# Patient Record
Sex: Male | Born: 1950
Health system: Southern US, Community
[De-identification: ages and names within clinical notes are randomized; demographics above are authoritative.]

## PROBLEM LIST (undated history)

## (undated) DIAGNOSIS — I10 Essential (primary) hypertension: Secondary | ICD-10-CM

## (undated) DIAGNOSIS — I2510 Atherosclerotic heart disease of native coronary artery without angina pectoris: Secondary | ICD-10-CM

## (undated) HISTORY — PX: CORONARY STENT PLACEMENT: SHX1402

---

## 2009-01-29 ENCOUNTER — Ambulatory Visit: Payer: Self-pay | Admitting: Diagnostic Radiology

## 2009-01-29 ENCOUNTER — Emergency Department (HOSPITAL_BASED_OUTPATIENT_CLINIC_OR_DEPARTMENT_OTHER): Admission: EM | Admit: 2009-01-29 | Discharge: 2009-01-29 | Payer: Self-pay | Admitting: Emergency Medicine

## 2011-02-03 LAB — COMPREHENSIVE METABOLIC PANEL
AST: 43 U/L — ABNORMAL HIGH (ref 0–37)
Albumin: 3.7 g/dL (ref 3.5–5.2)
BUN: 18 mg/dL (ref 6–23)
Calcium: 8.6 mg/dL (ref 8.4–10.5)
Creatinine, Ser: 1 mg/dL (ref 0.4–1.5)
GFR calc Af Amer: >60 mL/min (ref >60)
GFR calc non Af Amer: >60 mL/min (ref >60)

## 2011-02-03 LAB — DIFFERENTIAL
Eosinophils Relative: 2 % (ref 0–5)
Lymphocytes Relative: 15 % (ref 12–46)
Lymphs Abs: 1.5 10*3/uL (ref 0.7–4.0)
Monocytes Absolute: 0.9 10*3/uL (ref 0.1–1.0)
Neutro Abs: 7.6 10*3/uL (ref 1.7–7.7)

## 2011-02-03 LAB — LIPASE, BLOOD: Lipase: 22 U/L — ABNORMAL LOW (ref 23–300)

## 2011-02-03 LAB — CBC
HCT: 40.8 % (ref 39.0–52.0)
MCHC: 34.8 g/dL (ref 30.0–36.0)
MCV: 95.4 fL (ref 78.0–100.0)
Platelets: 231 10*3/uL (ref 150–400)

## 2011-02-03 LAB — POCT CARDIAC MARKERS
CKMB, poc: 24.4 ng/mL (ref 1.0–8.0)
Troponin i, poc: 0.88 ng/mL (ref 0.00–0.09)

## 2016-12-23 DIAGNOSIS — I739 Peripheral vascular disease, unspecified: Secondary | ICD-10-CM | POA: Diagnosis not present

## 2016-12-23 DIAGNOSIS — I252 Old myocardial infarction: Secondary | ICD-10-CM | POA: Diagnosis not present

## 2016-12-23 DIAGNOSIS — E7800 Pure hypercholesterolemia, unspecified: Secondary | ICD-10-CM | POA: Diagnosis not present

## 2016-12-23 DIAGNOSIS — I2510 Atherosclerotic heart disease of native coronary artery without angina pectoris: Secondary | ICD-10-CM | POA: Diagnosis not present

## 2016-12-30 DIAGNOSIS — I2510 Atherosclerotic heart disease of native coronary artery without angina pectoris: Secondary | ICD-10-CM | POA: Diagnosis not present

## 2016-12-30 DIAGNOSIS — E7800 Pure hypercholesterolemia, unspecified: Secondary | ICD-10-CM | POA: Diagnosis not present

## 2017-03-25 DIAGNOSIS — Z85828 Personal history of other malignant neoplasm of skin: Secondary | ICD-10-CM | POA: Diagnosis not present

## 2017-03-25 DIAGNOSIS — L814 Other melanin hyperpigmentation: Secondary | ICD-10-CM | POA: Diagnosis not present

## 2017-03-25 DIAGNOSIS — L821 Other seborrheic keratosis: Secondary | ICD-10-CM | POA: Diagnosis not present

## 2017-03-25 DIAGNOSIS — C44311 Basal cell carcinoma of skin of nose: Secondary | ICD-10-CM | POA: Diagnosis not present

## 2017-03-25 DIAGNOSIS — Z08 Encounter for follow-up examination after completed treatment for malignant neoplasm: Secondary | ICD-10-CM | POA: Diagnosis not present

## 2017-03-25 DIAGNOSIS — D485 Neoplasm of uncertain behavior of skin: Secondary | ICD-10-CM | POA: Diagnosis not present

## 2017-03-25 DIAGNOSIS — L570 Actinic keratosis: Secondary | ICD-10-CM | POA: Diagnosis not present

## 2017-04-26 ENCOUNTER — Other Ambulatory Visit: Payer: Self-pay | Admitting: *Deleted

## 2017-04-26 NOTE — Patient Outreach (Signed)
[facility]) Care Management  04/26/2017  Calvin Moses [birth year] [id number]   Member identified as high risk according to [employer] health questionnaire.  Call placed to introduce [facility] care management services and perform telephone screening.  No answer, HIPAA compliant voice message left.  Will await call back, if no call back, will follow up within the next week.  Valente David, [place], MSN [place] [phone]

## 2017-04-29 ENCOUNTER — Other Ambulatory Visit: Payer: Self-pay | Admitting: *Deleted
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
## 2017-05-05 ENCOUNTER — Encounter: Payer: Self-pay | Admitting: *Deleted

## 2017-05-12 DIAGNOSIS — C44311 Basal cell carcinoma of skin of nose: Secondary | ICD-10-CM | POA: Diagnosis not present

## 2017-05-12 DIAGNOSIS — N529 Male erectile dysfunction, unspecified: Secondary | ICD-10-CM | POA: Diagnosis not present

## 2017-05-23 DIAGNOSIS — M109 Gout, unspecified: Secondary | ICD-10-CM | POA: Diagnosis not present

## 2017-05-23 DIAGNOSIS — S53401A Unspecified sprain of right elbow, initial encounter: Secondary | ICD-10-CM | POA: Diagnosis not present

## 2017-06-07 DIAGNOSIS — C44311 Basal cell carcinoma of skin of nose: Secondary | ICD-10-CM | POA: Diagnosis not present

## 2017-11-24 DIAGNOSIS — D123 Benign neoplasm of transverse colon: Secondary | ICD-10-CM | POA: Diagnosis not present

## 2017-11-24 DIAGNOSIS — D121 Benign neoplasm of appendix: Secondary | ICD-10-CM | POA: Diagnosis not present

## 2017-11-24 DIAGNOSIS — K635 Polyp of colon: Secondary | ICD-10-CM | POA: Diagnosis not present

## 2017-11-24 DIAGNOSIS — K648 Other hemorrhoids: Secondary | ICD-10-CM | POA: Diagnosis not present

## 2017-11-24 DIAGNOSIS — Z86010 Personal history of colonic polyps: Secondary | ICD-10-CM | POA: Diagnosis not present

## 2017-11-24 DIAGNOSIS — Z800 Family history of malignant neoplasm of digestive organs: Secondary | ICD-10-CM | POA: Diagnosis not present

## 2017-11-24 DIAGNOSIS — K5730 Diverticulosis of large intestine without perforation or abscess without bleeding: Secondary | ICD-10-CM | POA: Diagnosis not present

## 2017-11-24 DIAGNOSIS — Z1211 Encounter for screening for malignant neoplasm of colon: Secondary | ICD-10-CM | POA: Diagnosis not present

## 2017-12-02 DIAGNOSIS — J019 Acute sinusitis, unspecified: Secondary | ICD-10-CM | POA: Diagnosis not present

## 2017-12-02 DIAGNOSIS — M109 Gout, unspecified: Secondary | ICD-10-CM | POA: Diagnosis not present

## 2018-01-12 DIAGNOSIS — H524 Presbyopia: Secondary | ICD-10-CM | POA: Diagnosis not present

## 2018-06-02 DIAGNOSIS — E7849 Other hyperlipidemia: Secondary | ICD-10-CM | POA: Diagnosis not present

## 2018-06-02 DIAGNOSIS — I2510 Atherosclerotic heart disease of native coronary artery without angina pectoris: Secondary | ICD-10-CM | POA: Diagnosis not present

## 2018-06-02 DIAGNOSIS — Z8673 Personal history of transient ischemic attack (TIA), and cerebral infarction without residual deficits: Secondary | ICD-10-CM | POA: Diagnosis not present

## 2018-06-02 DIAGNOSIS — I70213 Atherosclerosis of native arteries of extremities with intermittent claudication, bilateral legs: Secondary | ICD-10-CM | POA: Diagnosis not present

## 2018-12-08 DIAGNOSIS — E7849 Other hyperlipidemia: Secondary | ICD-10-CM | POA: Diagnosis not present

## 2018-12-08 DIAGNOSIS — I739 Peripheral vascular disease, unspecified: Secondary | ICD-10-CM | POA: Diagnosis not present

## 2018-12-08 DIAGNOSIS — Z23 Encounter for immunization: Secondary | ICD-10-CM | POA: Diagnosis not present

## 2018-12-08 DIAGNOSIS — Z125 Encounter for screening for malignant neoplasm of prostate: Secondary | ICD-10-CM | POA: Diagnosis not present

## 2018-12-08 DIAGNOSIS — C44311 Basal cell carcinoma of skin of nose: Secondary | ICD-10-CM | POA: Diagnosis not present

## 2018-12-08 DIAGNOSIS — Z0000 Encounter for general adult medical examination without abnormal findings: Secondary | ICD-10-CM | POA: Diagnosis not present

## 2018-12-08 DIAGNOSIS — I10 Essential (primary) hypertension: Secondary | ICD-10-CM | POA: Diagnosis not present

## 2018-12-08 DIAGNOSIS — I2510 Atherosclerotic heart disease of native coronary artery without angina pectoris: Secondary | ICD-10-CM | POA: Diagnosis not present

## 2018-12-09 DIAGNOSIS — Z125 Encounter for screening for malignant neoplasm of prostate: Secondary | ICD-10-CM | POA: Diagnosis not present

## 2018-12-09 DIAGNOSIS — E785 Hyperlipidemia, unspecified: Secondary | ICD-10-CM | POA: Diagnosis not present

## 2018-12-09 DIAGNOSIS — Z0000 Encounter for general adult medical examination without abnormal findings: Secondary | ICD-10-CM | POA: Diagnosis not present

## 2019-02-20 DIAGNOSIS — M25551 Pain in right hip: Secondary | ICD-10-CM | POA: Diagnosis not present

## 2019-02-20 DIAGNOSIS — M25552 Pain in left hip: Secondary | ICD-10-CM | POA: Diagnosis not present

## 2019-02-20 DIAGNOSIS — M160 Bilateral primary osteoarthritis of hip: Secondary | ICD-10-CM | POA: Diagnosis not present

## 2019-02-27 DIAGNOSIS — M160 Bilateral primary osteoarthritis of hip: Secondary | ICD-10-CM | POA: Diagnosis not present

## 2019-03-07 DIAGNOSIS — M160 Bilateral primary osteoarthritis of hip: Secondary | ICD-10-CM | POA: Diagnosis not present

## 2019-03-09 DIAGNOSIS — M160 Bilateral primary osteoarthritis of hip: Secondary | ICD-10-CM | POA: Diagnosis not present

## 2019-03-14 DIAGNOSIS — M160 Bilateral primary osteoarthritis of hip: Secondary | ICD-10-CM | POA: Diagnosis not present

## 2019-03-15 DIAGNOSIS — L218 Other seborrheic dermatitis: Secondary | ICD-10-CM | POA: Diagnosis not present

## 2019-03-15 DIAGNOSIS — C44729 Squamous cell carcinoma of skin of left lower limb, including hip: Secondary | ICD-10-CM | POA: Diagnosis not present

## 2019-03-15 DIAGNOSIS — L111 Transient acantholytic dermatosis [Grover]: Secondary | ICD-10-CM | POA: Diagnosis not present

## 2019-03-16 DIAGNOSIS — M160 Bilateral primary osteoarthritis of hip: Secondary | ICD-10-CM | POA: Diagnosis not present

## 2019-03-21 DIAGNOSIS — M160 Bilateral primary osteoarthritis of hip: Secondary | ICD-10-CM | POA: Diagnosis not present

## 2019-03-23 DIAGNOSIS — M160 Bilateral primary osteoarthritis of hip: Secondary | ICD-10-CM | POA: Diagnosis not present

## 2019-03-28 DIAGNOSIS — M160 Bilateral primary osteoarthritis of hip: Secondary | ICD-10-CM | POA: Diagnosis not present

## 2019-03-30 DIAGNOSIS — M160 Bilateral primary osteoarthritis of hip: Secondary | ICD-10-CM | POA: Diagnosis not present

## 2019-04-04 DIAGNOSIS — M160 Bilateral primary osteoarthritis of hip: Secondary | ICD-10-CM | POA: Diagnosis not present

## 2019-04-06 DIAGNOSIS — M160 Bilateral primary osteoarthritis of hip: Secondary | ICD-10-CM | POA: Diagnosis not present

## 2019-04-11 DIAGNOSIS — M160 Bilateral primary osteoarthritis of hip: Secondary | ICD-10-CM | POA: Diagnosis not present

## 2019-04-19 DIAGNOSIS — M160 Bilateral primary osteoarthritis of hip: Secondary | ICD-10-CM | POA: Diagnosis not present

## 2019-05-23 DIAGNOSIS — Z859 Personal history of malignant neoplasm, unspecified: Secondary | ICD-10-CM | POA: Diagnosis not present

## 2019-05-23 DIAGNOSIS — L218 Other seborrheic dermatitis: Secondary | ICD-10-CM | POA: Diagnosis not present

## 2019-06-05 DIAGNOSIS — I2510 Atherosclerotic heart disease of native coronary artery without angina pectoris: Secondary | ICD-10-CM | POA: Diagnosis not present

## 2019-06-05 DIAGNOSIS — I70213 Atherosclerosis of native arteries of extremities with intermittent claudication, bilateral legs: Secondary | ICD-10-CM | POA: Diagnosis not present

## 2019-06-05 DIAGNOSIS — I10 Essential (primary) hypertension: Secondary | ICD-10-CM | POA: Diagnosis not present

## 2019-06-05 DIAGNOSIS — E7849 Other hyperlipidemia: Secondary | ICD-10-CM | POA: Diagnosis not present

## 2019-06-29 DIAGNOSIS — R21 Rash and other nonspecific skin eruption: Secondary | ICD-10-CM | POA: Diagnosis not present

## 2019-07-06 DIAGNOSIS — F1020 Alcohol dependence, uncomplicated: Secondary | ICD-10-CM | POA: Diagnosis not present

## 2019-07-13 DIAGNOSIS — L218 Other seborrheic dermatitis: Secondary | ICD-10-CM | POA: Diagnosis not present

## 2019-07-13 DIAGNOSIS — L308 Other specified dermatitis: Secondary | ICD-10-CM | POA: Diagnosis not present

## 2019-08-30 DIAGNOSIS — C44529 Squamous cell carcinoma of skin of other part of trunk: Secondary | ICD-10-CM | POA: Diagnosis not present

## 2019-08-30 DIAGNOSIS — C4442 Squamous cell carcinoma of skin of scalp and neck: Secondary | ICD-10-CM | POA: Diagnosis not present

## 2019-08-30 DIAGNOSIS — L570 Actinic keratosis: Secondary | ICD-10-CM | POA: Diagnosis not present

## 2019-08-30 DIAGNOSIS — L821 Other seborrheic keratosis: Secondary | ICD-10-CM | POA: Diagnosis not present

## 2019-12-20 DIAGNOSIS — C44311 Basal cell carcinoma of skin of nose: Secondary | ICD-10-CM | POA: Diagnosis not present

## 2019-12-20 DIAGNOSIS — M160 Bilateral primary osteoarthritis of hip: Secondary | ICD-10-CM | POA: Diagnosis not present

## 2019-12-20 DIAGNOSIS — I739 Peripheral vascular disease, unspecified: Secondary | ICD-10-CM | POA: Diagnosis not present

## 2019-12-20 DIAGNOSIS — I2510 Atherosclerotic heart disease of native coronary artery without angina pectoris: Secondary | ICD-10-CM | POA: Diagnosis not present

## 2019-12-20 DIAGNOSIS — K4090 Unilateral inguinal hernia, without obstruction or gangrene, not specified as recurrent: Secondary | ICD-10-CM | POA: Diagnosis not present

## 2019-12-20 DIAGNOSIS — E7849 Other hyperlipidemia: Secondary | ICD-10-CM | POA: Diagnosis not present

## 2019-12-20 DIAGNOSIS — Z122 Encounter for screening for malignant neoplasm of respiratory organs: Secondary | ICD-10-CM | POA: Diagnosis not present

## 2019-12-20 DIAGNOSIS — Z87891 Personal history of nicotine dependence: Secondary | ICD-10-CM | POA: Diagnosis not present

## 2019-12-20 DIAGNOSIS — Z0000 Encounter for general adult medical examination without abnormal findings: Secondary | ICD-10-CM | POA: Diagnosis not present

## 2019-12-20 DIAGNOSIS — I10 Essential (primary) hypertension: Secondary | ICD-10-CM | POA: Diagnosis not present

## 2019-12-22 DIAGNOSIS — Z0000 Encounter for general adult medical examination without abnormal findings: Secondary | ICD-10-CM | POA: Diagnosis not present

## 2019-12-22 DIAGNOSIS — I10 Essential (primary) hypertension: Secondary | ICD-10-CM | POA: Diagnosis not present

## 2019-12-22 DIAGNOSIS — Z125 Encounter for screening for malignant neoplasm of prostate: Secondary | ICD-10-CM | POA: Diagnosis not present

## 2019-12-22 DIAGNOSIS — E7849 Other hyperlipidemia: Secondary | ICD-10-CM | POA: Diagnosis not present

## 2019-12-25 DIAGNOSIS — I739 Peripheral vascular disease, unspecified: Secondary | ICD-10-CM | POA: Diagnosis not present

## 2019-12-25 DIAGNOSIS — E7849 Other hyperlipidemia: Secondary | ICD-10-CM | POA: Diagnosis not present

## 2019-12-25 DIAGNOSIS — K4090 Unilateral inguinal hernia, without obstruction or gangrene, not specified as recurrent: Secondary | ICD-10-CM | POA: Diagnosis not present

## 2019-12-25 DIAGNOSIS — I10 Essential (primary) hypertension: Secondary | ICD-10-CM | POA: Diagnosis not present

## 2019-12-25 DIAGNOSIS — I2510 Atherosclerotic heart disease of native coronary artery without angina pectoris: Secondary | ICD-10-CM | POA: Diagnosis not present

## 2020-03-24 ENCOUNTER — Emergency Department (HOSPITAL_BASED_OUTPATIENT_CLINIC_OR_DEPARTMENT_OTHER)
Admission: EM | Admit: 2020-03-24 | Discharge: 2020-03-24 | Disposition: A | Discharge status: Home / Self Care | Payer: PPO | Attending: Emergency Medicine | Admitting: Emergency Medicine

## 2020-03-24 ENCOUNTER — Emergency Department (HOSPITAL_BASED_OUTPATIENT_CLINIC_OR_DEPARTMENT_OTHER): Payer: PPO

## 2020-03-24 ENCOUNTER — Encounter (HOSPITAL_BASED_OUTPATIENT_CLINIC_OR_DEPARTMENT_OTHER): Payer: Self-pay | Admitting: *Deleted

## 2020-03-24 ENCOUNTER — Other Ambulatory Visit: Payer: Self-pay

## 2020-03-24 DIAGNOSIS — Z955 Presence of coronary angioplasty implant and graft: Secondary | ICD-10-CM | POA: Diagnosis not present

## 2020-03-24 DIAGNOSIS — K4090 Unilateral inguinal hernia, without obstruction or gangrene, not specified as recurrent: Secondary | ICD-10-CM

## 2020-03-24 DIAGNOSIS — I10 Essential (primary) hypertension: Secondary | ICD-10-CM | POA: Diagnosis not present

## 2020-03-24 DIAGNOSIS — Z79899 Other long term (current) drug therapy: Secondary | ICD-10-CM | POA: Insufficient documentation

## 2020-03-24 DIAGNOSIS — I2510 Atherosclerotic heart disease of native coronary artery without angina pectoris: Secondary | ICD-10-CM | POA: Diagnosis not present

## 2020-03-24 DIAGNOSIS — Z7902 Long term (current) use of antithrombotics/antiplatelets: Secondary | ICD-10-CM | POA: Insufficient documentation

## 2020-03-24 DIAGNOSIS — K5730 Diverticulosis of large intestine without perforation or abscess without bleeding: Secondary | ICD-10-CM | POA: Diagnosis not present

## 2020-03-24 DIAGNOSIS — R1030 Lower abdominal pain, unspecified: Secondary | ICD-10-CM | POA: Diagnosis not present

## 2020-03-24 DIAGNOSIS — R1084 Generalized abdominal pain: Secondary | ICD-10-CM

## 2020-03-24 HISTORY — DX: Atherosclerotic heart disease of native coronary artery without angina pectoris: I25.10

## 2020-03-24 HISTORY — DX: Essential (primary) hypertension: I10

## 2020-03-24 LAB — COMPREHENSIVE METABOLIC PANEL
ALT: 17 U/L (ref 0–44)
AST: 17 U/L (ref 15–41)
Albumin: 4.1 g/dL (ref 3.5–5.0)
Alkaline Phosphatase: 61 U/L (ref 38–126)
Anion gap: 9 (ref 5–15)
BUN: 16 mg/dL (ref 8–23)
CO2: 25 mmol/L (ref 22–32)
Calcium: 9.2 mg/dL (ref 8.9–10.3)
Chloride: 103 mmol/L (ref 98–111)
Creatinine, Ser: 0.96 mg/dL (ref 0.61–1.24)
GFR calc Af Amer: >60 mL/min (ref >60)
GFR calc non Af Amer: >60 mL/min (ref >60)
Glucose, Bld: 117 mg/dL — ABNORMAL HIGH (ref 70–99)
Potassium: 3.9 mmol/L (ref 3.5–5.1)
Sodium: 137 mmol/L (ref 135–145)
Total Bilirubin: 0.4 mg/dL (ref 0.3–1.2)
Total Protein: 7.2 g/dL (ref 6.5–8.1)

## 2020-03-24 LAB — URINALYSIS, ROUTINE W REFLEX MICROSCOPIC
Bilirubin Urine: NEGATIVE
Glucose, UA: NEGATIVE mg/dL
Ketones, ur: NEGATIVE mg/dL
Nitrite: NEGATIVE
Protein, ur: NEGATIVE mg/dL
Specific Gravity, Urine: 1.02 (ref 1.005–1.030)
pH: 6 (ref 5.0–8.0)

## 2020-03-24 LAB — URINALYSIS, MICROSCOPIC (REFLEX)

## 2020-03-24 LAB — CBC
HCT: 45.5 % (ref 39.0–52.0)
Hemoglobin: 15.3 g/dL (ref 13.0–17.0)
MCH: 32.1 pg (ref 26.0–34.0)
MCHC: 33.6 g/dL (ref 30.0–36.0)
MCV: 95.6 fL (ref 80.0–100.0)
Platelets: 221 10*3/uL (ref 150–400)
RBC: 4.76 MIL/uL (ref 4.22–5.81)
RDW: 12.7 % (ref 11.5–15.5)
WBC: 5.6 10*3/uL (ref 4.0–10.5)
nRBC: 0 % (ref 0.0–0.2)

## 2020-03-24 LAB — LIPASE, BLOOD: Lipase: 19 U/L (ref 11–51)

## 2020-03-24 MED ORDER — SODIUM CHLORIDE 0.9 % IV BOLUS
1000.0000 mL | Freq: Once | INTRAVENOUS | Status: AC
  Administered 2020-03-24: 1000 mL via INTRAVENOUS

## 2020-03-24 MED ORDER — SODIUM CHLORIDE 0.9% FLUSH
3.0000 mL | Freq: Once | INTRAVENOUS | Status: DC
  Filled 2020-03-24: qty 3

## 2020-03-24 MED ORDER — IOHEXOL 300 MG/ML  SOLN
100.0000 mL | Freq: Once | INTRAMUSCULAR | Status: AC
  Administered 2020-03-24: 100 mL via INTRAVENOUS

## 2020-03-24 MED ORDER — MORPHINE SULFATE (PF) 4 MG/ML IV SOLN
4.0000 mg | Freq: Once | INTRAVENOUS | Status: DC
  Filled 2020-03-24: qty 1

## 2020-03-24 MED ORDER — ONDANSETRON HCL 4 MG/2ML IJ SOLN
4.0000 mg | Freq: Once | INTRAMUSCULAR | Status: DC
  Filled 2020-03-24: qty 2

## 2020-03-24 NOTE — ED Triage Notes (Signed)
Abdominal pain off and on x 2-3 months. States he took his BP and it was elevated.

## 2020-03-24 NOTE — ED Provider Notes (Signed)
[place] EMERGENCY DEPARTMENT Provider Note   CSN: [id number] Arrival date & time: 03/24/20  1528     History Chief Complaint  Patient presents with  . Abdominal Pain    Calvin Moses is a 69 y.o. male.  69 yo M with a cc of abdominal pain.  Is been going on for at least 3 months.  Appears off and on.  This morning was worse than it had been.  Has not sought medical care for this.  Describes it as crampy lower pain.  Usually occurs after a bowel movement occurred after a bowel movement this morning.  Denies diarrhea denies bloody or dark stool.  He has a direct inguinal hernia that is caused him issues off and on.  He does not think that has been more painful than normal.  Has been moving his bowels normally.  No nausea or vomiting.  No fevers.  The history is provided by the patient.  Abdominal Pain Pain location:  LLQ and RLQ Pain quality: cramping   Pain radiates to:  Does not radiate Pain severity:  Moderate Onset quality:  Gradual Duration:  12 weeks Timing:  Intermittent Progression:  Waxing and waning Chronicity:  Recurrent Relieved by:  Nothing Worsened by:  Bowel movements Ineffective treatments:  None tried Associated symptoms: no chest pain, no chills, no constipation, no diarrhea, no fever, no shortness of breath and no vomiting        Past Medical History:  Diagnosis Date  . Coronary artery disease   . Hypertension     There are no problems to display for this patient.   Past Surgical History:  Procedure Laterality Date  . CORONARY STENT PLACEMENT         No family history on file.  Social History   Tobacco Use  . Smoking status: Never Smoker  . Smokeless tobacco: Never Used  Substance Use Topics  . Alcohol use: Yes  . Drug use: Never    Home Medications Prior to Admission medications   Medication Sig Start Date End Date Taking? Authorizing Provider  atorvastatin (LIPITOR) 20 MG tablet Take by mouth. 01/11/17  Yes  [provider]  clopidogrel (PLAVIX) 75 MG tablet TAKE 1 TABLET BY MOUTH DAILY 12/24/16  Yes [provider]  metoprolol succinate (TOPROL-XL) 25 MG 24 hr tablet  01/21/20   [provider]    Allergies    Patient has no known allergies.  Review of Systems   Review of Systems  Constitutional: Negative for chills and fever.  HENT: Negative for congestion and facial swelling.   Eyes: Negative for discharge and visual disturbance.  Respiratory: Negative for shortness of breath.   Cardiovascular: Negative for chest pain and palpitations.  Gastrointestinal: Positive for abdominal pain. Negative for blood in stool, constipation, diarrhea and vomiting.  Musculoskeletal: Negative for arthralgias and myalgias.  Skin: Negative for color change and rash.  Neurological: Negative for tremors, syncope and headaches.  Psychiatric/Behavioral: Negative for confusion and dysphoric mood.    Physical Exam Updated Vital Signs BP (!) 150/75 (BP Location: Left Arm)   Pulse 66   Temp 98.1 F (36.7 C) (Oral)   Resp 18   Ht 6' (1.829 m)   Wt 83 kg   SpO2 100%   BMI 24.82 kg/m   Physical Exam Vitals and nursing note reviewed.  Constitutional:      Appearance: He is well-developed.  HENT:     Head: Normocephalic and atraumatic.  Eyes:  Pupils: Pupils are equal, round, and reactive to light.  Neck:     Vascular: No JVD.  Cardiovascular:     Rate and Rhythm: Normal rate and regular rhythm.     Heart sounds: No murmur. No friction rub. No gallop.   Pulmonary:     Effort: No respiratory distress.     Breath sounds: No wheezing.  Abdominal:     General: There is no distension.     Tenderness: There is no guarding or rebound.     Comments: Benign abdominal exam  Musculoskeletal:        General: Normal range of motion.     Cervical back: Normal range of motion and neck supple.  Skin:    Coloration: Skin is not pale.     Findings: No rash.  Neurological:      Mental Status: He is alert and oriented to person, place, and time.  Psychiatric:        Behavior: Behavior normal.     ED Results / Procedures / Treatments   Labs (all labs ordered are listed, but only abnormal results are displayed) Labs Reviewed  COMPREHENSIVE METABOLIC PANEL - Abnormal; Notable for the following components:      Result Value   Glucose, Bld 117 (*)    All other components within normal limits  URINALYSIS, ROUTINE W REFLEX MICROSCOPIC - Abnormal; Notable for the following components:   Hgb urine dipstick TRACE (*)    Leukocytes,Ua TRACE (*)    All other components within normal limits  URINALYSIS, MICROSCOPIC (REFLEX) - Abnormal; Notable for the following components:   Bacteria, UA RARE (*)    All other components within normal limits  LIPASE, BLOOD  CBC    EKG None  Radiology CT ABDOMEN PELVIS W CONTRAST  Result Date: 03/24/2020 CLINICAL DATA:  Abdominal pain off and on x 2-3 months. Hypertension. EXAM: CT ABDOMEN AND PELVIS WITH CONTRAST TECHNIQUE: Multidetector CT imaging of the abdomen and pelvis was performed using the standard protocol following bolus administration of intravenous contrast. CONTRAST:  158mL OMNIPAQUE IOHEXOL 300 MG/ML  SOLN COMPARISON:  None. FINDINGS: Lower chest: There is rounded atelectasis at the right lung base. Bilateral calcified pleural plaques. Hepatobiliary: There is a cyst in the anterior liver measuring 1.5 cm. The liver is otherwise unremarkable. Normal appearance of the gallbladder. No intra or extrahepatic biliary duct dilation. Pancreas: Unremarkable. No pancreatic ductal dilatation or surrounding inflammatory changes. Spleen: Normal in size without focal abnormality. Adrenals/Urinary Tract: Adrenal glands are unremarkable. Kidneys are normal, without renal calculi, focal lesion, or hydronephrosis. Bladder is unremarkable. Stomach/Bowel: Stomach is within normal limits. Appendix appears normal. No evidence of bowel wall  thickening, distention, or inflammatory changes. Numerous colonic diverticula without evidence of diverticulitis. Vascular/Lymphatic: Severe aortic atherosclerosis. No evidence of aneurysm. No enlarged abdominal or pelvic lymph nodes. Reproductive: Prostate is unremarkable. Other: Bilateral inguinal hernias.  No abdominopelvic ascites. Musculoskeletal: Probable small bone island in the right iliac wing. Severe degenerative disc disease in the lower lumbar spine. IMPRESSION: 1. No acute findings in the abdomen or pelvis. 2. Colonic diverticulosis without evidence of diverticulitis. 3. Aortic atherosclerosis. 4. Calcified pleural plaques in the bilateral lung bases likely related to prior asbestos exposure. Aortic Atherosclerosis (ICD10-I70.0). Electronically Signed   By: Audie Pinto M.D.   On: 03/24/2020 17:41    Procedures Hernia reduction  Date/Time: 03/24/2020 5:17 PM Performed by: Deno Etienne, DO Authorized by: Deno Etienne, DO  Consent: Verbal consent obtained. Risks and benefits: risks, benefits and  alternatives were discussed Consent given by: patient Patient understanding: patient states understanding of the procedure being performed Imaging studies: imaging studies not available Patient identity confirmed: verbally with patient Time out: Immediately prior to procedure a "time out" was called to verify the correct patient, procedure, equipment, support staff and site/side marked as required. Local anesthesia used: no  Anesthesia: Local anesthesia used: no  Sedation: Patient sedated: no  Patient tolerance: patient tolerated the procedure well with no immediate complications Comments: Right direct inguinal hernia reduced without issue.    (including critical care time)  Medications Ordered in ED Medications  sodium chloride flush (NS) 0.9 % injection 3 mL (3 mLs Intravenous Not Given 03/24/20 1625)  morphine 4 MG/ML injection 4 mg (has no administration in time range)   ondansetron (ZOFRAN) injection 4 mg (has no administration in time range)  sodium chloride 0.9 % bolus 1,000 mL (0 mLs Intravenous Stopped 03/24/20 1812)  iohexol (OMNIPAQUE) 300 MG/ML solution 100 mL (100 mLs Intravenous Contrast Given 03/24/20 1715)    ED Course  I have reviewed the triage vital signs and the nursing notes.  Pertinent labs & imaging results that were available during my care of the patient were reviewed by me and considered in my medical decision making (see chart for details).    MDM Rules/Calculators/A&P                      69 yo M with a chief complaints of colicky abdominal pain.  This been going on for about 3 months now.  Benign abdominal exam for me.  Based on the patient's age will obtain a CT scan.  Lab work.  Reassess.  CT scan without acute pathology.  Lab work without significant abnormality.  UA negative for infection.  Will discharge patient home.  GI and general surgery follow-up.  9:23 PM:  I have discussed the diagnosis/risks/treatment options with the patient and believe the pt to be eligible for discharge home to follow-up with PCP, GI gen surgery. We also discussed returning to the ED immediately if new or worsening sx occur. We discussed the sx which are most concerning (e.g., sudden worsening pain, fever, inability to tolerate by mouth) that necessitate immediate return. Medications administered to the patient during their visit and any new prescriptions provided to the patient are listed below.  Medications given during this visit Medications  sodium chloride flush (NS) 0.9 % injection 3 mL (3 mLs Intravenous Not Given 03/24/20 1625)  morphine 4 MG/ML injection 4 mg (has no administration in time range)  ondansetron (ZOFRAN) injection 4 mg (has no administration in time range)  sodium chloride 0.9 % bolus 1,000 mL (0 mLs Intravenous Stopped 03/24/20 1812)  iohexol (OMNIPAQUE) 300 MG/ML solution 100 mL (100 mLs Intravenous Contrast Given 03/24/20 1715)      The patient appears reasonably screen and/or stabilized for discharge and I doubt any other medical condition or other [facility] requiring further screening, evaluation, or treatment in the ED at this time prior to discharge.    Final Clinical Impression(s) / ED Diagnoses Final diagnoses:  Generalized abdominal pain  Direct inguinal hernia of right side    Rx / DC Orders ED Discharge Orders    None       Deno Etienne, DO 03/24/20 2123

## 2020-03-24 NOTE — Discharge Instructions (Signed)
We did not identify a cause of your abdominal pain here today in the emergency department.  Likely your lab work and CT scan did not show any acute pathology.  Please follow-up with your gastroenterologist and your family doctor.  Please return for worsening abdominal pain fever or inability to eat or drink.

## 2020-06-11 DIAGNOSIS — D485 Neoplasm of uncertain behavior of skin: Secondary | ICD-10-CM | POA: Diagnosis not present

## 2020-06-11 DIAGNOSIS — X32XXXS Exposure to sunlight, sequela: Secondary | ICD-10-CM | POA: Diagnosis not present

## 2020-06-11 DIAGNOSIS — L821 Other seborrheic keratosis: Secondary | ICD-10-CM | POA: Diagnosis not present

## 2020-06-11 DIAGNOSIS — D239 Other benign neoplasm of skin, unspecified: Secondary | ICD-10-CM | POA: Diagnosis not present

## 2020-06-11 DIAGNOSIS — Z85828 Personal history of other malignant neoplasm of skin: Secondary | ICD-10-CM | POA: Diagnosis not present

## 2020-06-11 DIAGNOSIS — C44529 Squamous cell carcinoma of skin of other part of trunk: Secondary | ICD-10-CM | POA: Diagnosis not present

## 2020-06-11 DIAGNOSIS — Z859 Personal history of malignant neoplasm, unspecified: Secondary | ICD-10-CM | POA: Diagnosis not present

## 2020-06-11 DIAGNOSIS — L578 Other skin changes due to chronic exposure to nonionizing radiation: Secondary | ICD-10-CM | POA: Diagnosis not present

## 2020-06-11 DIAGNOSIS — L570 Actinic keratosis: Secondary | ICD-10-CM | POA: Diagnosis not present

## 2020-06-11 DIAGNOSIS — L814 Other melanin hyperpigmentation: Secondary | ICD-10-CM | POA: Diagnosis not present

## 2020-06-11 DIAGNOSIS — W908XXS Exposure to other nonionizing radiation, sequela: Secondary | ICD-10-CM | POA: Diagnosis not present

## 2020-07-07 DIAGNOSIS — C44529 Squamous cell carcinoma of skin of other part of trunk: Secondary | ICD-10-CM | POA: Diagnosis not present

## 2020-07-24 IMAGING — CT CT ABD-PELV W/ CM
2 of 5 series · 16 of 46 positions shown, 18 images · IV contrast (Omnipaque)
Comparison: None.

CLINICAL DATA: Abdominal pain off and on x 2-3 months.
Hypertension.

EXAM:
CT ABDOMEN AND PELVIS WITH CONTRAST
TECHNIQUE: Multidetector CT imaging of the abdomen and pelvis was performed
using the standard protocol following bolus administration of
intravenous contrast.
CONTRAST:  100mL OMNIPAQUE IOHEXOL 300 MG/ML  SOLN

[Series 2: axial st · axial · 0.72mm/px · z∈[+545,+940]mm · 13 of 89 slices shown, 15 images]
[im 5/89  soft-tissue]
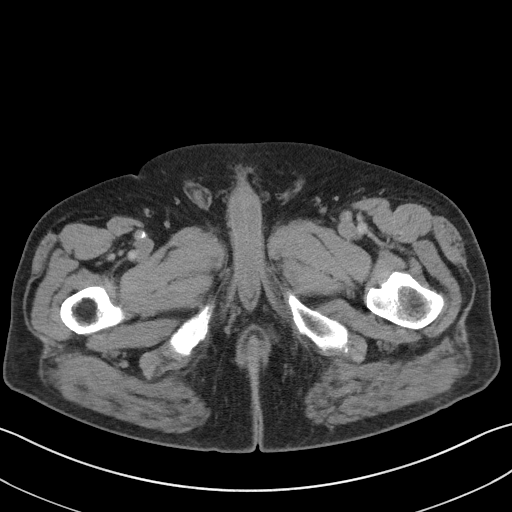
[im 5/89  bone]
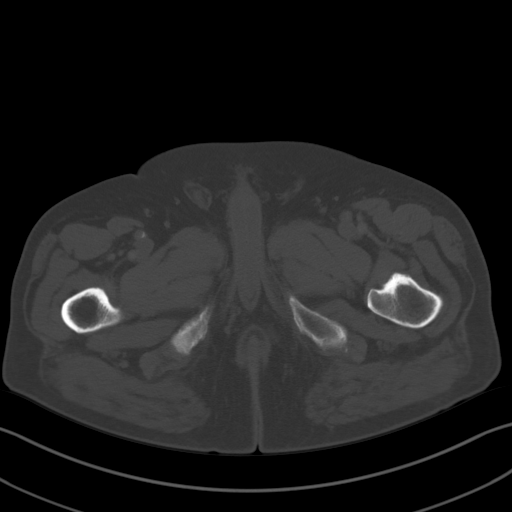
[im 14/89  soft-tissue]
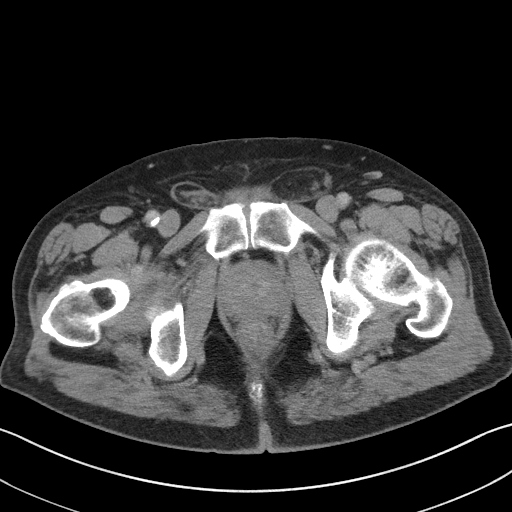
[im 18/89  soft-tissue]
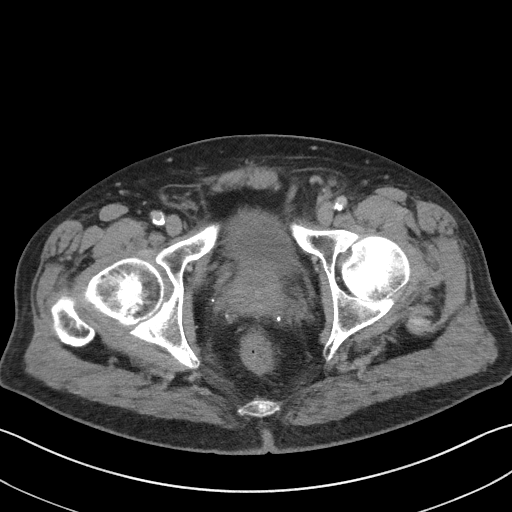
[im 27/89  soft-tissue]
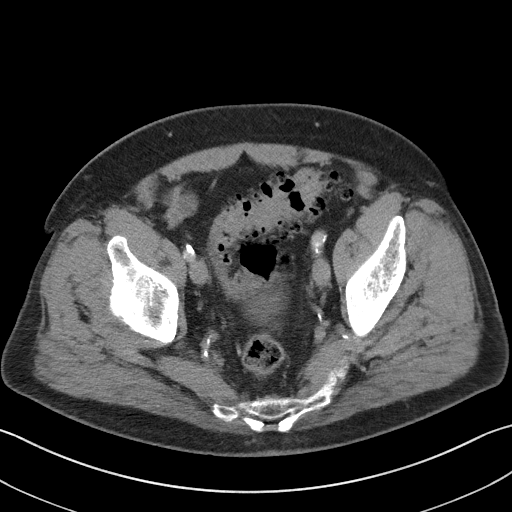
[im 31/89  soft-tissue]
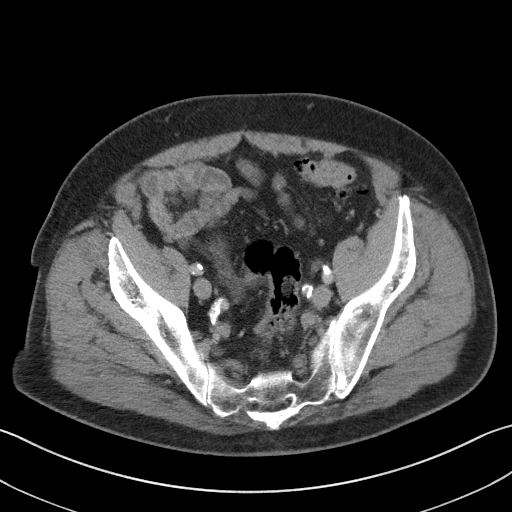
[im 40/89  soft-tissue]
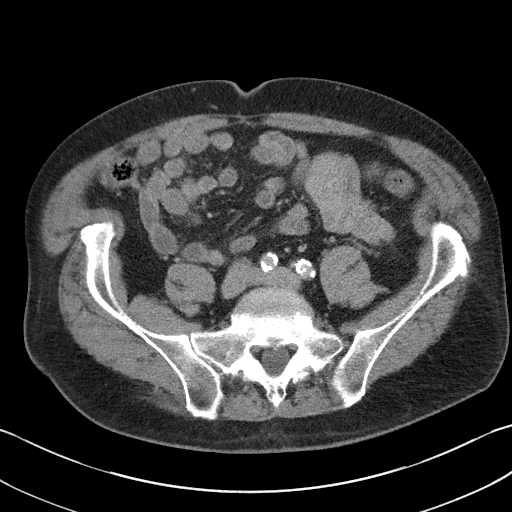
[im 45/89  soft-tissue]
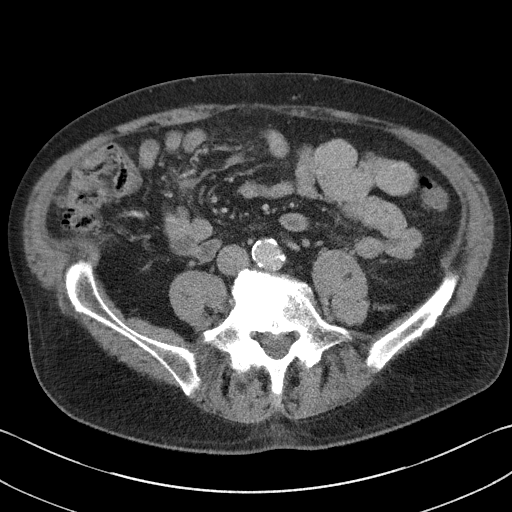
[im 49/89  soft-tissue]
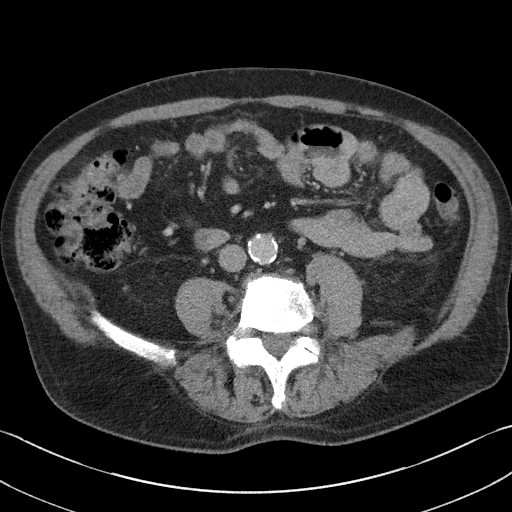
[im 58/89  soft-tissue]
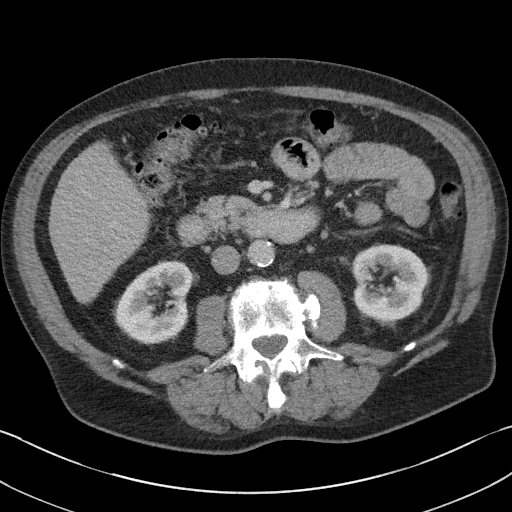
[im 58/89  bone]
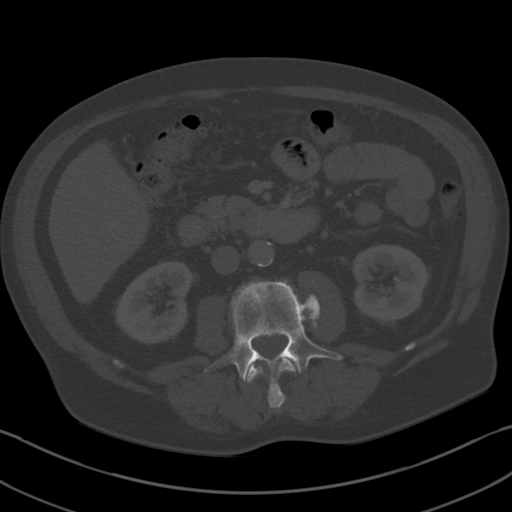
[im 62/89  soft-tissue]
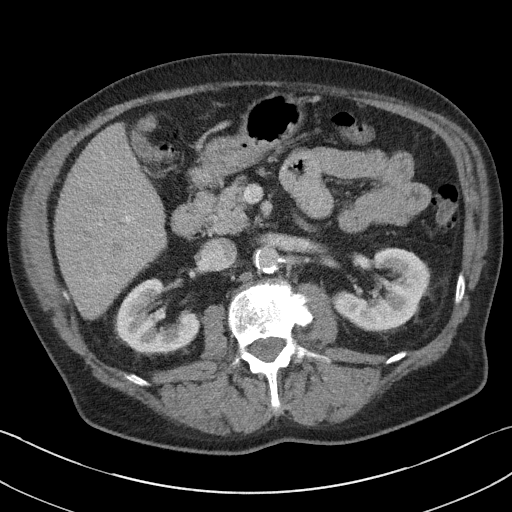
[im 71/89  soft-tissue]
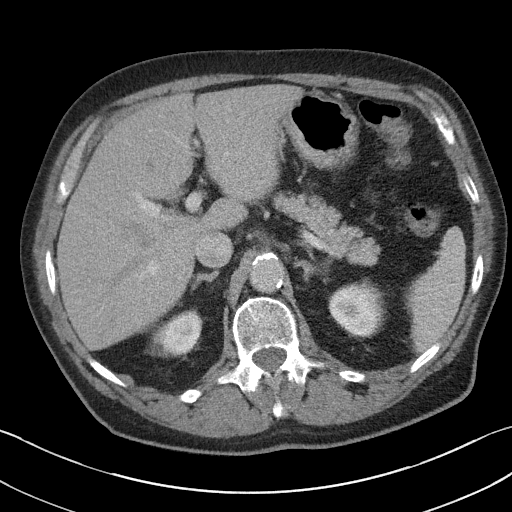
[im 75/89  soft-tissue]
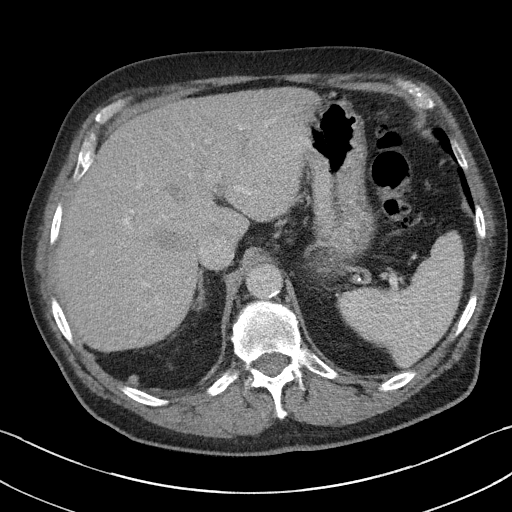
[im 84/89  soft-tissue]
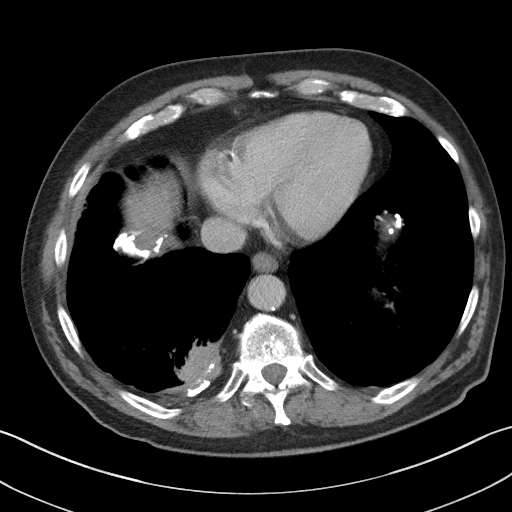

[Series 5: coronal st · coronal · 0.78mm/px · 3 of 101 slices shown]
[im 34/101  soft-tissue]
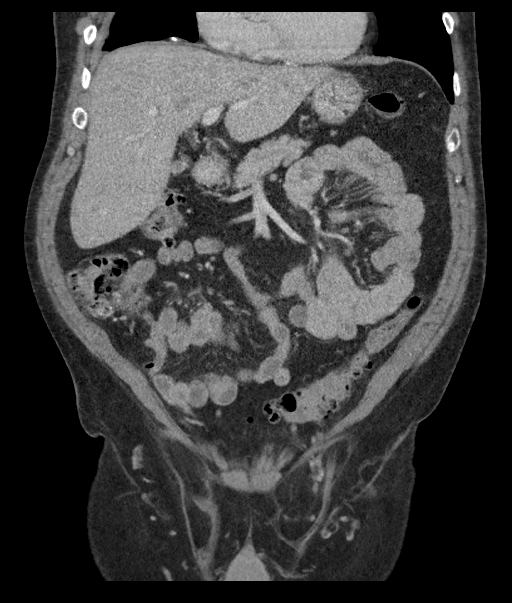
[im 45/101  soft-tissue]
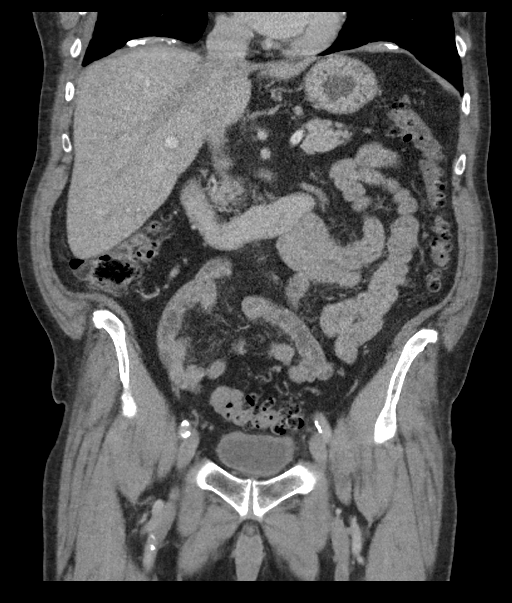
[im 56/101  soft-tissue]
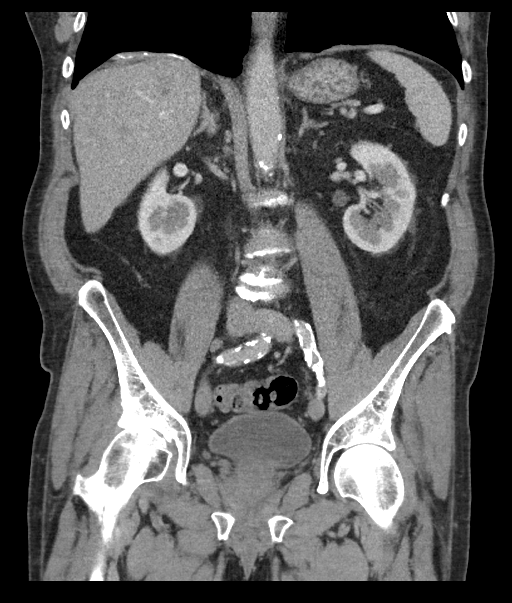

[16 of 46 positions shown; findings below may reference images not displayed]

FINDINGS: Lower chest: There is rounded atelectasis at the right lung base.
Bilateral calcified pleural plaques.

Hepatobiliary: There is a cyst in the anterior liver measuring
cm. The liver is otherwise unremarkable. Normal appearance of the
gallbladder. No intra or extrahepatic biliary duct dilation.

Pancreas: Unremarkable. No pancreatic ductal dilatation or
surrounding inflammatory changes.

Spleen: Normal in size without focal abnormality.

Adrenals/Urinary Tract: Adrenal glands are unremarkable. Kidneys are
normal, without renal calculi, focal lesion, or hydronephrosis.
Bladder is unremarkable.

Stomach/Bowel: Stomach is within normal limits. Appendix appears
normal. No evidence of bowel wall thickening, distention, or
inflammatory changes. Numerous colonic diverticula without evidence
of diverticulitis.

Vascular/Lymphatic: Severe aortic atherosclerosis. No evidence of
aneurysm. No enlarged abdominal or pelvic lymph nodes.

Reproductive: Prostate is unremarkable.

Other: Bilateral inguinal hernias.  No abdominopelvic ascites.

Musculoskeletal: Probable small bone island in the right iliac wing.
Severe degenerative disc disease in the lower lumbar spine.
IMPRESSION: 1. No acute findings in the abdomen or pelvis.
2. Colonic diverticulosis without evidence of diverticulitis.
3. Aortic atherosclerosis.
4. Calcified pleural plaques in the bilateral lung bases likely
related to prior asbestos exposure.

Aortic Atherosclerosis ([id number]-[id number]).

## 2020-08-23 ENCOUNTER — Other Ambulatory Visit: Payer: Self-pay

## 2020-08-23 ENCOUNTER — Inpatient Hospital Stay (HOSPITAL_COMMUNITY): Payer: PPO

## 2020-08-23 ENCOUNTER — Emergency Department (HOSPITAL_BASED_OUTPATIENT_CLINIC_OR_DEPARTMENT_OTHER): Payer: PPO

## 2020-08-23 ENCOUNTER — Encounter (HOSPITAL_BASED_OUTPATIENT_CLINIC_OR_DEPARTMENT_OTHER): Payer: Self-pay | Admitting: Emergency Medicine

## 2020-08-23 ENCOUNTER — Inpatient Hospital Stay (HOSPITAL_BASED_OUTPATIENT_CLINIC_OR_DEPARTMENT_OTHER)
Admission: EM | Admit: 2020-08-23 | Discharge: 2020-08-24 | DRG: 201 | Disposition: A | Discharge status: Home / Self Care | Payer: PPO | Attending: Cardiothoracic Surgery | Admitting: Cardiothoracic Surgery

## 2020-08-23 DIAGNOSIS — J939 Pneumothorax, unspecified: Secondary | ICD-10-CM | POA: Diagnosis not present

## 2020-08-23 DIAGNOSIS — R0602 Shortness of breath: Secondary | ICD-10-CM | POA: Diagnosis present

## 2020-08-23 DIAGNOSIS — Z955 Presence of coronary angioplasty implant and graft: Secondary | ICD-10-CM | POA: Diagnosis not present

## 2020-08-23 DIAGNOSIS — J90 Pleural effusion, not elsewhere classified: Secondary | ICD-10-CM | POA: Diagnosis not present

## 2020-08-23 DIAGNOSIS — I10 Essential (primary) hypertension: Secondary | ICD-10-CM | POA: Diagnosis present

## 2020-08-23 DIAGNOSIS — I2510 Atherosclerotic heart disease of native coronary artery without angina pectoris: Secondary | ICD-10-CM | POA: Diagnosis present

## 2020-08-23 DIAGNOSIS — Z7902 Long term (current) use of antithrombotics/antiplatelets: Secondary | ICD-10-CM | POA: Diagnosis not present

## 2020-08-23 DIAGNOSIS — J069 Acute upper respiratory infection, unspecified: Secondary | ICD-10-CM | POA: Diagnosis not present

## 2020-08-23 DIAGNOSIS — Z20822 Contact with and (suspected) exposure to covid-19: Secondary | ICD-10-CM | POA: Diagnosis not present

## 2020-08-23 DIAGNOSIS — F17200 Nicotine dependence, unspecified, uncomplicated: Secondary | ICD-10-CM | POA: Diagnosis not present

## 2020-08-23 DIAGNOSIS — J449 Chronic obstructive pulmonary disease, unspecified: Secondary | ICD-10-CM | POA: Diagnosis present

## 2020-08-23 DIAGNOSIS — J9383 Other pneumothorax: Principal | ICD-10-CM | POA: Diagnosis present

## 2020-08-23 DIAGNOSIS — Z79899 Other long term (current) drug therapy: Secondary | ICD-10-CM

## 2020-08-23 DIAGNOSIS — J9811 Atelectasis: Secondary | ICD-10-CM | POA: Diagnosis not present

## 2020-08-23 LAB — RESPIRATORY PANEL BY RT PCR (FLU A&B, COVID)
Influenza A by PCR: NEGATIVE
Influenza B by PCR: NEGATIVE
SARS Coronavirus 2 by RT PCR: NEGATIVE

## 2020-08-23 LAB — COMPREHENSIVE METABOLIC PANEL
ALT: 18 U/L (ref 0–44)
AST: 17 U/L (ref 15–41)
Albumin: 4.2 g/dL (ref 3.5–5.0)
Alkaline Phosphatase: 60 U/L (ref 38–126)
Anion gap: 11 (ref 5–15)
BUN: 15 mg/dL (ref 8–23)
CO2: 24 mmol/L (ref 22–32)
Calcium: 9.1 mg/dL (ref 8.9–10.3)
Chloride: 101 mmol/L (ref 98–111)
Creatinine, Ser: 0.87 mg/dL (ref 0.61–1.24)
GFR, Estimated: >60 mL/min (ref >60)
Glucose, Bld: 92 mg/dL (ref 70–99)
Potassium: 4.1 mmol/L (ref 3.5–5.1)
Sodium: 136 mmol/L (ref 135–145)
Total Bilirubin: 0.6 mg/dL (ref 0.3–1.2)
Total Protein: 7.2 g/dL (ref 6.5–8.1)

## 2020-08-23 LAB — CBC WITH DIFFERENTIAL/PLATELET
Abs Immature Granulocytes: 0.03 10*3/uL (ref 0.00–0.07)
Basophils Absolute: 0.1 10*3/uL (ref 0.0–0.1)
Basophils Relative: 1 %
Eosinophils Absolute: 0.2 10*3/uL (ref 0.0–0.5)
Eosinophils Relative: 2 %
HCT: 44 % (ref 39.0–52.0)
Hemoglobin: 15.1 g/dL (ref 13.0–17.0)
Immature Granulocytes: 0 %
Lymphocytes Relative: 18 %
Lymphs Abs: 1.3 10*3/uL (ref 0.7–4.0)
MCH: 32.7 pg (ref 26.0–34.0)
MCHC: 34.3 g/dL (ref 30.0–36.0)
MCV: 95.2 fL (ref 80.0–100.0)
Monocytes Absolute: 0.9 10*3/uL (ref 0.1–1.0)
Monocytes Relative: 13 %
Neutro Abs: 4.7 10*3/uL (ref 1.7–7.7)
Neutrophils Relative %: 66 %
Platelets: 252 10*3/uL (ref 150–400)
RBC: 4.62 MIL/uL (ref 4.22–5.81)
RDW: 12.9 % (ref 11.5–15.5)
WBC: 7.1 10*3/uL (ref 4.0–10.5)
nRBC: 0 % (ref 0.0–0.2)

## 2020-08-23 LAB — LACTIC ACID, PLASMA: Lactic Acid, Venous: 1 mmol/L (ref 0.5–1.9)

## 2020-08-23 MED ORDER — NICOTINE 14 MG/24HR TD PT24: 14.0000 mg | MEDICATED_PATCH | Freq: Every day | TRANSDERMAL | Status: DC

## 2020-08-23 MED ORDER — FLUTICASONE FUROATE-VILANTEROL 100-25 MCG/INH IN AEPB
1.0000 | INHALATION_SPRAY | Freq: Every day | RESPIRATORY_TRACT | Status: DC
  Filled 2020-08-23: qty 28

## 2020-08-23 MED ORDER — TRAZODONE HCL 50 MG PO TABS: 50.0000 mg | ORAL_TABLET | Freq: Every evening | ORAL | Status: DC | PRN

## 2020-08-23 MED ORDER — SODIUM CHLORIDE 0.9% FLUSH: 3.0000 mL | Freq: Two times a day (BID) | INTRAVENOUS | Status: DC

## 2020-08-23 MED ORDER — ENOXAPARIN SODIUM 40 MG/0.4ML ~~LOC~~ SOLN
40.0000 mg | SUBCUTANEOUS | Status: DC
  Administered 2020-08-23: 40 mg via SUBCUTANEOUS

## 2020-08-23 MED ORDER — SODIUM CHLORIDE 0.9 % IV SOLN: 1.0000 g | INTRAVENOUS | Status: DC

## 2020-08-23 MED ORDER — GUAIFENESIN ER 600 MG PO TB12
600.0000 mg | ORAL_TABLET | Freq: Two times a day (BID) | ORAL | Status: DC
  Administered 2020-08-23: 600 mg via ORAL
  Filled 2020-08-23: qty 1

## 2020-08-23 MED ORDER — HYDROCOD POLST-CPM POLST ER 10-8 MG/5ML PO SUER
5.0000 mL | Freq: Two times a day (BID) | ORAL | Status: DC
  Administered 2020-08-23: 5 mL via ORAL
  Filled 2020-08-23: qty 5

## 2020-08-23 MED ORDER — SODIUM CHLORIDE 0.9 % IV SOLN
1.0000 g | Freq: Once | INTRAVENOUS | Status: AC
  Administered 2020-08-23: 1 g via INTRAVENOUS
  Filled 2020-08-23: qty 10

## 2020-08-23 MED ORDER — THIAMINE HCL 100 MG PO TABS
100.0000 mg | ORAL_TABLET | Freq: Every day | ORAL | Status: DC
  Administered 2020-08-23: 100 mg via ORAL
  Filled 2020-08-23: qty 1

## 2020-08-23 MED ORDER — SODIUM CHLORIDE 0.9 % IV SOLN
INTRAVENOUS | Status: DC | PRN
  Administered 2020-08-23: 500 mL via INTRAVENOUS
  Administered 2020-08-23: 250 mL via INTRAVENOUS

## 2020-08-23 MED ORDER — DOCUSATE SODIUM 100 MG PO CAPS
100.0000 mg | ORAL_CAPSULE | Freq: Two times a day (BID) | ORAL | Status: DC
  Administered 2020-08-23: 100 mg via ORAL
  Filled 2020-08-23: qty 1

## 2020-08-23 MED ORDER — ATORVASTATIN CALCIUM 10 MG PO TABS
20.0000 mg | ORAL_TABLET | Freq: Every day | ORAL | Status: DC
  Administered 2020-08-23: 20 mg via ORAL
  Filled 2020-08-23: qty 2

## 2020-08-23 MED ORDER — AZITHROMYCIN 250 MG PO TABS: 500.0000 mg | ORAL_TABLET | Freq: Every day | ORAL | Status: DC

## 2020-08-23 MED ORDER — SODIUM CHLORIDE 0.9 % IV SOLN: 500.0000 mg | INTRAVENOUS | Status: DC

## 2020-08-23 MED ORDER — SODIUM CHLORIDE 0.9 % IV SOLN
500.0000 mg | Freq: Once | INTRAVENOUS | Status: AC
  Administered 2020-08-23: 500 mg via INTRAVENOUS
  Filled 2020-08-23 (×2): qty 500

## 2020-08-23 MED ORDER — METOPROLOL SUCCINATE ER 25 MG PO TB24: 25.0000 mg | ORAL_TABLET | Freq: Every day | ORAL | Status: DC

## 2020-08-23 MED ORDER — CLOPIDOGREL BISULFATE 75 MG PO TABS: 75.0000 mg | ORAL_TABLET | Freq: Every day | ORAL | Status: DC

## 2020-08-23 MED ORDER — ASPIRIN EC 81 MG PO TBEC
81.0000 mg | DELAYED_RELEASE_TABLET | Freq: Every day | ORAL | Status: DC
  Filled 2020-08-23: qty 1

## 2020-08-23 MED ORDER — SORBITOL 70 % SOLN
30.0000 mL | Freq: Every day | Status: DC | PRN
  Filled 2020-08-23: qty 30

## 2020-08-23 MED ORDER — ENOXAPARIN SODIUM 40 MG/0.4ML ~~LOC~~ SOLN: 40.0000 mg | SUBCUTANEOUS | Status: DC

## 2020-08-23 MED ORDER — ONDANSETRON HCL 4 MG PO TABS: 4.0000 mg | ORAL_TABLET | Freq: Four times a day (QID) | ORAL | Status: DC | PRN

## 2020-08-23 MED ORDER — TRAMADOL HCL 50 MG PO TABS
50.0000 mg | ORAL_TABLET | Freq: Three times a day (TID) | ORAL | Status: DC | PRN
  Filled 2020-08-23: qty 1

## 2020-08-23 MED ORDER — ONDANSETRON HCL 4 MG/2ML IJ SOLN: 4.0000 mg | Freq: Four times a day (QID) | INTRAMUSCULAR | Status: DC | PRN

## 2020-08-23 NOTE — ED Notes (Signed)
Pt transferred from [facility] for pneumothorax. Pt VSS. NAD. A&Ox4 upon arrival.

## 2020-08-23 NOTE — ED Notes (Signed)
Dinner Tray Ordered @ [phone].

## 2020-08-23 NOTE — H&P (Signed)
[address]       [place] [address]             [phone]        Calvin Moses Medical Record #[id number] Date of Birth: [birth year]  Referring: No ref. provider found Primary Care: Rudene Anda, MD Primary Cardiologist:No primary care provider on file.  Chief Complaint:    Chief Complaint  Patient presents with  . pneumo/MCHP  Patient examined, images of today's chest x-ray personally reviewed and discussed with patient  History of Present Illness:     69 year old active smoker with COPD presents with severe coughing, congestion, chest discomfort and a 15-20% left pneumothorax noted on chest x-ray.  Chest x-ray shows cl assic characteristics of COPD without pneumonia or effusion. Patient has been having a strong cough for several days.  It is productive but not cloudy.  He denies fever.  The cough has increased in severity over the past 2 days.  No prior history of spontaneous pneumothorax, thoracic trauma, or chest tubes.  Patient had a PCI in the past and is on chronic Plavix followed by cardiology.  Patient denies hemoptysis or shortness of breath.  He is not on home oxygen.  Current Activity/ Functional Status: Retired lives independently with wife   Zubrod Score: At the time of surgery this patient's most appropriate activity status/level should be described as: []     0    Normal activity, no symptoms [x]     1    Restricted in physical strenuous activity but ambulatory, able to do out light work []     2    Ambulatory and capable of self care, unable to do work activities, up and about                 more than 50%  Of the time                            []     3    Only limited self care, in bed greater than 50% of waking hours []     4    Completely disabled, no self care, confined to bed or chair []     5    Moribund  Past Medical History:  Diagnosis Date  . Coronary artery disease   . Hypertension     Past Surgical History:   Procedure Laterality Date  . CORONARY STENT PLACEMENT      Social History   Tobacco Use  Smoking Status Never Smoker  Smokeless Tobacco Never Used    Social History   Substance and Sexual Activity  Alcohol Use Yes     No Known Allergies  Current Facility-Administered Medications  Medication Dose Route Frequency Provider Last Rate Last Admin  . 0.9 %  sodium chloride infusion   Intravenous PRN Gareth Morgan, MD 10 mL/hr at 08/23/20 1439 500 mL at 08/23/20 1439  . aspirin EC tablet 81 mg  81 mg Oral Daily Prescott Gum, Collier Salina, MD      . atorvastatin (LIPITOR) tablet 20 mg  20 mg Oral Daily Prescott Gum, Collier Salina, MD      . azithromycin [facility]) 500 mg in sodium chloride 0.9 % 250 mL IVPB  500 mg Intravenous Q24H Ivin Poot, MD       Followed by  . [START ON 08/24/2020] azithromycin (ZITHROMAX) tablet 500 mg  500 mg Oral Daily Prescott Gum,  Collier Salina, MD      . Derrill Memo ON 08/24/2020] cefTRIAXone (ROCEPHIN) 1 g in sodium chloride 0.9 % 100 mL IVPB  1 g Intravenous Q24H Prescott Gum, Collier Salina, MD      . chlorpheniramine-HYDROcodone (TUSSIONEX) 10-8 MG/5ML suspension 5 mL  5 mL Oral Q12H Prescott Gum, Collier Salina, MD      . Derrill Memo ON 08/24/2020] clopidogrel (PLAVIX) tablet 75 mg  75 mg Oral Daily Prescott Gum, Collier Salina, MD      . docusate sodium (COLACE) capsule 100 mg  100 mg Oral BID Prescott Gum, Collier Salina, MD      . enoxaparin (LOVENOX) injection 40 mg  40 mg Subcutaneous Q24H Prescott Gum, Collier Salina, MD      . fluticasone furoate-vilanterol (BREO ELLIPTA) 100-25 MCG/INH 1 puff  1 puff Inhalation Daily Prescott Gum, Collier Salina, MD      . guaiFENesin [facility]) 12 hr tablet 600 mg  600 mg Oral BID Prescott Gum, Collier Salina, MD      . Derrill Memo ON 08/24/2020] metoprolol succinate (TOPROL-XL) 24 hr tablet 25 mg  25 mg Oral Daily Prescott Gum, Collier Salina, MD      . nicotine (NICODERM CQ - dosed in mg/24 hours) patch 14 mg  14 mg Transdermal Daily Prescott Gum, Collier Salina, MD      . ondansetron [facility]) tablet 4 mg  4 mg Oral Q6H PRN Prescott Gum, Collier Salina, MD        Or  . ondansetron [facility]) injection 4 mg  4 mg Intravenous Q6H PRN Prescott Gum, Collier Salina, MD      . sodium chloride flush (NS) 0.9 % injection 3 mL  3 mL Intravenous Q12H Prescott Gum, Collier Salina, MD      . sorbitol 70 % solution 30 mL  30 mL Oral Daily PRN Prescott Gum, Collier Salina, MD      . thiamine tablet 100 mg  100 mg Oral Daily Prescott Gum, Collier Salina, MD      . traMADol [facility]) tablet 50 mg  50 mg Oral Q8H PRN Prescott Gum, Collier Salina, MD      . traZODone (DESYREL) tablet 50 mg  50 mg Oral QHS PRN Ivin Poot, MD       Current Outpatient Medications  Medication Sig Dispense Refill  . acetaminophen (TYLENOL) 500 MG tablet Take 1,000 mg by mouth every 6 (six) hours as needed for mild pain.    . Ascorbic Acid (VITAMIN C) 1000 MG tablet Take 500 mg by mouth daily.    Marland Kitchen atorvastatin (LIPITOR) 20 MG tablet Take 10 mg by mouth daily.     . clopidogrel (PLAVIX) 75 MG tablet Take 75 mg by mouth daily.     . metoprolol succinate (TOPROL-XL) 25 MG 24 hr tablet Take 25 mg by mouth daily.       (Not in a hospital admission)   No family history on file.   Review of Systems:   ROS Right-hand-dominant No history of bleeding diathesis or blood transfusion No history of thoracic trauma    Cardiac Review of Systems: Y or  [    ]= no  Chest Pain [    ]  Resting SOB [   ] Exertional SOB  [  ]  Orthopnea [  ]   Pedal Edema [   ]    Palpitations [  ] Syncope  [  ]   Presyncope [   ]  General Review of Systems: [Y] = yes [  ]=no Constitional: recent weight change [  ]; anorexia [  ];  fatigue [  ]; nausea [  ]; night sweats [  ]; fever [  ]; or chills [  ]                                                               Dental: Last Dentist visit:   Eye : blurred vision [  ]; diplopia [   ]; vision changes [  ];  Amaurosis fugax[  ]; Resp: cough Blue.Reese  ];  wheezing[  ];  hemoptysis[  ]; shortness of breath[  ]; paroxysmal nocturnal dyspnea[  ]; dyspnea on exertion[  ]; or orthopnea[  ];  GI: right inguinal hernia from strong  coughing     gallstones[  ], vomiting[  ];  dysphagia[  ]; melena[  ];  hematochezia [  ]; heartburn[  ];   Hx of  Colonoscopy[  ]; GU: kidney stones [  ]; hematuria[  ];   dysuria [  ];  nocturia[  ];  history of     obstruction [  ]; urinary frequency [  ]             Skin: rash, swelling[  ];, hair loss[  ];  peripheral edema[  ];  or itching[  ]; Musculosketetal: myalgias[  ];  joint swelling[  ];  joint erythema[  ];  joint pain[  ];  back pain[y  ];  Heme/Lymph: bruising[  ];  bleeding[  ];  anemia[  ];  Neuro: TIA[  ];  headaches[  ];  stroke[  ];  vertigo[  ];  seizures[  ];   paresthesias[  ];  difficulty walking[  ];  Psych:depression[  ]; anxiety[  ];  Endocrine: diabetes[  ];  thyroid dysfunction[  ];                Physical Exam: BP (!) 143/81   Pulse (!) 59   Temp 97.7 F (36.5 C) (Oral)   Resp 13   Ht 6' (1.829 m)   Wt 83 kg   SpO2 97%   BMI 24.82 kg/m        Physical Exam  General: 69 year old well-nourished male anxious but no acute distress HEENT: Normocephalic pupils equal , dentition adequate Neck: Supple without JVD, adenopathy, or bruit Chest:  symmetrical breath sounds, bilateral scattered rhonchi, no tenderness             or deformity of the chest wall Cardiovascular: Regular rate and rhythm, no murmur, no gallop, peripheral pulses             palpable in all extremities Abdomen:  Soft, nontender, no palpable mass or organomegaly Extremities: Warm, well-perfused, no clubbing cyanosis edema or tenderness,              no venous stasis changes of the legs Rectal/GU: Deferred Neuro: Grossly non--focal and symmetrical throughout Skin: Clean and dry without rash or ulceration`o   Diagnostic Studies & Laboratory data:     Recent Radiology Findings:   DG Chest 2 View  Result Date: 08/23/2020 CLINICAL DATA:  69 year old male with cough EXAM: CHEST - 2 VIEW COMPARISON:  01/29/2009 FINDINGS: Cardiomediastinal silhouette within normal limits. Left-sided  pneumothorax. Compared to the prior plain film there are new coarsened linear opacities at the periphery of  the lungs potentially pleural plaques. Reticulonodular opacities of the bilateral lungs. No pleural effusion. Stigmata of emphysema, with increased retrosternal airspace, flattened hemidiaphragms, increased AP diameter, and hyperinflation on the AP view. IMPRESSION: Left-sided pneumothorax without evidence of tension. Reticulonodular opacities of the lungs, potentially developing chronic fibrotic changes of and/or multifocal infection. Coarsened linear opacities bilateral lungs min new from the prior, may be related to development of pleural plaques. these results were discussed by telephone at the time of interpretation on 08/23/2020 at 11:30 am with Dr. Gareth Morgan Electronically Signed   By: Corrie Mckusick D.O.   On: 08/23/2020 11:32     I have independently reviewed the above radiologic studies and discussed with the patient   Recent Lab Findings: Lab Results  Component Value Date   WBC 7.1 08/23/2020   HGB 15.1 08/23/2020   HCT 44.0 08/23/2020   PLT 252 08/23/2020   GLUCOSE 92 08/23/2020   ALT 18 08/23/2020   AST 17 08/23/2020   NA 136 08/23/2020   K 4.1 08/23/2020   CL 101 08/23/2020   CREATININE 0.87 08/23/2020   BUN 15 08/23/2020   CO2 24 08/23/2020      Assessment / Plan:   COPD with upper respiratory infection and violent coughing resulting in left pneumothorax. Spontaneous left pneumothorax 15-20%.  This is not significant enough for chest tube but with his underlying COPD he will need to be admitted for observation to make sure the pneumothorax does not expand.  He will be given oxygen, antibiotics, bronchodilators, and pain medicine.       08/23/2020 4:19 PM

## 2020-08-23 NOTE — ED Provider Notes (Signed)
[facility] EMERGENCY DEPARTMENT Provider Note   CSN: [id number] Arrival date & time: 08/23/20  1026     History Chief Complaint  Patient presents with  . pneumo/MCHP    Calvin Moses is a 69 y.o. male with PMH of HTN and CAD on Plavix who was transferred to [facility] after he was found to have pneumothorax on chest x-ray.  Case was discussed with Dr. Prescott Gum who will see patient here in the ED regarding possible chest tube placement.  On my examination, patient is resting comfortably in no acute distress.  He will intermittently have episodes of cough.  Patient informs me that he has a history of ACS and is s/p cardiac stent.  He then took a hiatus from smoking tobacco for a 5-year period, before resuming approximately 4 years ago.  He states that it is not uncommon for him to have a cough, typically associated with eating, however in the past week he has been experiencing increasing cough symptoms.  He states that last night was the worst and that it was uncontrolled coughing fits.  It is productive of clearish sputum.  He denies any fevers or chills, chest pain, respiratory distress or difficulty breathing, abdominal pain, nausea or vomiting, history of clots or clotting disorder, abdominal pain, or other symptoms.  HPI     Past Medical History:  Diagnosis Date  . Coronary artery disease   . Hypertension     There are no problems to display for this patient.   Past Surgical History:  Procedure Laterality Date  . CORONARY STENT PLACEMENT         No family history on file.  Social History   Tobacco Use  . Smoking status: Never Smoker  . Smokeless tobacco: Never Used  Vaping Use  . Vaping Use: Never used  Substance Use Topics  . Alcohol use: Yes  . Drug use: Never    Home Medications Prior to Admission medications   Medication Sig Start Date End Date Taking? Authorizing Provider  atorvastatin (LIPITOR) 20 MG  tablet Take by mouth. 01/11/17   [provider]  clopidogrel (PLAVIX) 75 MG tablet TAKE 1 TABLET BY MOUTH DAILY 12/24/16   [provider]  metoprolol succinate (TOPROL-XL) 25 MG 24 hr tablet  01/21/20   [provider]    Allergies    Patient has no known allergies.  Review of Systems   Review of Systems  All other systems reviewed and are negative.   Physical Exam Updated Vital Signs BP (!) 147/73 (BP Location: Left Arm)   Pulse 64   Temp 97.7 F (36.5 C) (Oral)   Resp 15   Ht 6' (1.829 m)   Wt 83 kg   SpO2 97%   BMI 24.82 kg/m   Physical Exam Vitals and nursing note reviewed. Exam conducted with a chaperone present.  Constitutional:      Appearance: Normal appearance.  HENT:     Head: Normocephalic and atraumatic.  Eyes:     General: No scleral icterus.    Conjunctiva/sclera: Conjunctivae normal.  Cardiovascular:     Rate and Rhythm: Normal rate and regular rhythm.     Pulses: Normal pulses.     Heart sounds: Normal heart sounds.  Pulmonary:     Effort: Pulmonary effort is normal.     Comments: No tachypnea.  Breath sounds intact bilaterally.  Mild expiratory wheezing.  No acute distress. Musculoskeletal:  Cervical back: Normal range of motion. No rigidity.     Right lower leg: No edema.     Left lower leg: No edema.  Skin:    General: Skin is dry.  Neurological:     Mental Status: He is alert and oriented to person, place, and time.     GCS: GCS eye subscore is 4. GCS verbal subscore is 5. GCS motor subscore is 6.  Psychiatric:        Mood and Affect: Mood normal.        Behavior: Behavior normal.        Thought Content: Thought content normal.     ED Results / Procedures / Treatments   Labs (all labs ordered are listed, but only abnormal results are displayed) Labs Reviewed  RESPIRATORY PANEL BY RT PCR (FLU A&B, COVID)  CULTURE, BLOOD (ROUTINE X 2)  CULTURE, BLOOD (ROUTINE X 2)  CBC WITH DIFFERENTIAL/PLATELET    COMPREHENSIVE METABOLIC PANEL  LACTIC ACID, PLASMA    EKG None  Radiology DG Chest 2 View  Result Date: 08/23/2020 CLINICAL DATA:  69 year old male with cough EXAM: CHEST - 2 VIEW COMPARISON:  01/29/2009 FINDINGS: Cardiomediastinal silhouette within normal limits. Left-sided pneumothorax. Compared to the prior plain film there are new coarsened linear opacities at the periphery of the lungs potentially pleural plaques. Reticulonodular opacities of the bilateral lungs. No pleural effusion. Stigmata of emphysema, with increased retrosternal airspace, flattened hemidiaphragms, increased AP diameter, and hyperinflation on the AP view. IMPRESSION: Left-sided pneumothorax without evidence of tension. Reticulonodular opacities of the lungs, potentially developing chronic fibrotic changes of and/or multifocal infection. Coarsened linear opacities bilateral lungs min new from the prior, may be related to development of pleural plaques. these results were discussed by telephone at the time of interpretation on 08/23/2020 at 11:30 am with Dr. Gareth Morgan Electronically Signed   By: Corrie Mckusick D.O.   On: 08/23/2020 11:32    Procedures Procedures (including critical care time)  Medications Ordered in ED Medications  azithromycin (ZITHROMAX) 500 mg in sodium chloride 0.9 % 250 mL IVPB (500 mg Intravenous New Bag/Given 08/23/20 1442)  0.9 %  sodium chloride infusion (500 mLs Intravenous New Bag/Given 08/23/20 1439)  cefTRIAXone (ROCEPHIN) 1 g in sodium chloride 0.9 % 100 mL IVPB (0 g Intravenous Stopped 08/23/20 1416)    ED Course  I have reviewed the triage vital signs and the nursing notes.  Pertinent labs & imaging results that were available during my care of the patient were reviewed by me and considered in my medical decision making (see chart for details).  Clinical Course as of Aug 23 1510  Sat Aug 23, 2020  1503 Spoke with Dr. Prescott Gum.  Will place patient on 5 L supplemental O2  via NRB.  He will see patient and admit.     [GG]    Clinical Course User Index [GG] Reita Chard   MDM Rules/Calculators/A&P                          Patient was sent here from [place] after plain films of chest revealed left-sided pneumothorax without tension.  Cardiothoracic surgeon Dr. Prescott Gum was consulted who agreed to admit patient for chest tube placement here at [facility].  There is also reticulonodular opacities in the lungs, potentially developing chronic fibrotic changes and/or multifocal infection.  Patient was empirically started on Rocephin and Zithromax prior to transfer.  Respiratory  panel and laboratory work-up all reassuring.  On my examination, patient is still breathing well and in no acute distress.  Spoke with Dr. Prescott Gum.  Will place patient on 5 L supplemental O2 via NRB.  He will see patient and admit.    Final Clinical Impression(s) / ED Diagnoses Final diagnoses:  Secondary spontaneous pneumothorax    Rx / DC Orders ED Discharge Orders    None       Corena Herter, PA-C 08/23/20 1511    Pattricia Boss, MD 08/23/20 2026

## 2020-08-23 NOTE — ED Provider Notes (Signed)
[facility] EMERGENCY DEPARTMENT Provider Note   CSN: [id number] Arrival date & time: 08/23/20  1026     History Chief Complaint  Patient presents with  . pneumo/MCHP    Calvin Moses is a 69 y.o. male.  HPI      69 year old male with history of coronary artery disease, hypertension, smoking, presents with concern for cough for 1 month, worsening over the last couple days.  Describes having congestion and coughing things up for a long period of time, but at least over the last month.  Reports that the cough is significantly worsened a couple days ago.  Denies shortness of breath or chest pain.  Denies lightheadedness.  Denies fevers.  Reports that he does smoke.  Denies history of known lung disease or pneumothorax.  Reports at one point he did cough so hard that he had a hernia.  Past Medical History:  Diagnosis Date  . Coronary artery disease   . Hypertension     There are no problems to display for this patient.   Past Surgical History:  Procedure Laterality Date  . CORONARY STENT PLACEMENT         No family history on file.  Social History   Tobacco Use  . Smoking status: Never Smoker  . Smokeless tobacco: Never Used  Vaping Use  . Vaping Use: Never used  Substance Use Topics  . Alcohol use: Yes  . Drug use: Never    Home Medications Prior to Admission medications   Medication Sig Start Date End Date Taking? Authorizing Provider  atorvastatin (LIPITOR) 20 MG tablet Take by mouth. 01/11/17   [provider]  clopidogrel (PLAVIX) 75 MG tablet TAKE 1 TABLET BY MOUTH DAILY 12/24/16   [provider]  metoprolol succinate (TOPROL-XL) 25 MG 24 hr tablet  01/21/20   [provider]    Allergies    Patient has no known allergies.  Review of Systems   Review of Systems  Constitutional: Negative for fever.  HENT: Negative for sore throat.   Eyes: Negative for visual disturbance.  Respiratory: Positive for cough.  Negative for shortness of breath.   Cardiovascular: Negative for chest pain.  Gastrointestinal: Negative for abdominal pain, nausea and vomiting.  Genitourinary: Negative for difficulty urinating.  Musculoskeletal: Negative for back pain and neck stiffness.  Skin: Negative for rash.  Neurological: Negative for syncope and headaches.    Physical Exam Updated Vital Signs BP (!) 162/91   Pulse 72   Temp 97.7 F (36.5 C) (Oral)   Resp 17   Ht 6' (1.829 m)   Wt 83 kg   SpO2 97%   BMI 24.82 kg/m   Physical Exam Vitals and nursing note reviewed.  Constitutional:      General: He is not in acute distress.    Appearance: He is well-developed. He is not diaphoretic.  HENT:     Head: Normocephalic and atraumatic.  Eyes:     Conjunctiva/sclera: Conjunctivae normal.  Cardiovascular:     Rate and Rhythm: Normal rate and regular rhythm.     Heart sounds: Normal heart sounds. No murmur heard.  No friction rub. No gallop.   Pulmonary:     Effort: Pulmonary effort is normal. No respiratory distress.     Breath sounds: Rales (left base) present. No wheezing.  Abdominal:     General: There is no distension.     Palpations: Abdomen is soft.     Tenderness: There is no abdominal tenderness. There  is no guarding.  Musculoskeletal:     Cervical back: Normal range of motion.  Skin:    General: Skin is warm and dry.  Neurological:     Mental Status: He is alert and oriented to person, place, and time.     ED Results / Procedures / Treatments   Labs (all labs ordered are listed, but only abnormal results are displayed) Labs Reviewed  RESPIRATORY PANEL BY RT PCR (FLU A&B, COVID)  CULTURE, BLOOD (ROUTINE X 2)  CULTURE, BLOOD (ROUTINE X 2)  CBC WITH DIFFERENTIAL/PLATELET  COMPREHENSIVE METABOLIC PANEL  LACTIC ACID, PLASMA    EKG None  Radiology DG Chest 2 View  Result Date: 08/23/2020 CLINICAL DATA:  69 year old male with cough EXAM: CHEST - 2 VIEW COMPARISON:  01/29/2009  FINDINGS: Cardiomediastinal silhouette within normal limits. Left-sided pneumothorax. Compared to the prior plain film there are new coarsened linear opacities at the periphery of the lungs potentially pleural plaques. Reticulonodular opacities of the bilateral lungs. No pleural effusion. Stigmata of emphysema, with increased retrosternal airspace, flattened hemidiaphragms, increased AP diameter, and hyperinflation on the AP view. IMPRESSION: Left-sided pneumothorax without evidence of tension. Reticulonodular opacities of the lungs, potentially developing chronic fibrotic changes of and/or multifocal infection. Coarsened linear opacities bilateral lungs min new from the prior, may be related to development of pleural plaques. these results were discussed by telephone at the time of interpretation on 08/23/2020 at 11:30 am with Dr. Gareth Morgan Electronically Signed   By: Corrie Mckusick D.O.   On: 08/23/2020 11:32    Procedures .Critical Care Performed by: Gareth Morgan, MD Authorized by: Gareth Morgan, MD   Critical care provider statement:    Critical care time (minutes):  30   Critical care was time spent personally by me on the following activities:  Discussions with consultants, evaluation of patient's response to treatment, examination of patient, ordering and performing treatments and interventions, ordering and review of laboratory studies, ordering and review of radiographic studies, pulse oximetry, obtaining history from patient or surrogate and review of old charts   (including critical care time)  Medications Ordered in ED Medications  azithromycin (ZITHROMAX) 500 mg in sodium chloride 0.9 % 250 mL IVPB (500 mg Intravenous New Bag/Given 08/23/20 1442)  0.9 %  sodium chloride infusion (500 mLs Intravenous New Bag/Given 08/23/20 1439)  cefTRIAXone (ROCEPHIN) 1 g in sodium chloride 0.9 % 100 mL IVPB (0 g Intravenous Stopped 08/23/20 1416)    ED Course  I have reviewed the  triage vital signs and the nursing notes.  Pertinent labs & imaging results that were available during my care of the patient were reviewed by me and considered in my medical decision making (see chart for details).  Clinical Course as of Aug 23 1520  Sat Aug 23, 2020  1503 Spoke with Dr. Prescott Gum.  Will place patient on 5 L supplemental O2 via NRB.  He will see patient and admit.     [GG]    Clinical Course User Index [GG] Reita Chard   MDM Rules/Calculators/A&P                          69 year old male with history of coronary artery disease, hypertension, smoking, presents with concern for cough for 1 month, worsening over the last couple days.   Chest x-ray completed in triage significant for a left-sided pneumothorax and reticular nodular opacities potentially fibrotic changes or multifocal infection.  Overall, he does not  have fever, leukocytosis and have a lower suspicion for infection, but given persistent cough and x-ray findings, will cover for community-acquired pneumonia with Rocephin and azithromycin.  No signs of sepsis on labs or vital signs.  COVID-19 testing negative.  Discussed left-sided pneumothorax with CT surgery, Dr. Prescott Gum.  He is hemodynamically stable, with normal oxygen saturations on room air, in no distress, and discussed that we will await chest tube placement until he is evaluated by CT surgery. Will transfer ED to ED. Dr. Gilford Raid accepting.    Final Clinical Impression(s) / ED Diagnoses Final diagnoses:  Spontaneous pneumothorax    Rx / DC Orders ED Discharge Orders    None       Gareth Morgan, MD 08/23/20 1521

## 2020-08-23 NOTE — ED Notes (Signed)
1st Lactic Acid normal. 2nd to be discontinued. 

## 2020-08-23 NOTE — ED Triage Notes (Signed)
Pt c/o productive cough x 1 week.  Pt denies exposure to others with illness.  Pt reports that he had Moderna COVID vaccine x 2.

## 2020-08-24 ENCOUNTER — Inpatient Hospital Stay (HOSPITAL_COMMUNITY): Payer: PPO

## 2020-08-24 ENCOUNTER — Other Ambulatory Visit: Payer: Self-pay | Admitting: Surgical

## 2020-08-24 LAB — BASIC METABOLIC PANEL
Anion gap: 8 (ref 5–15)
BUN: 11 mg/dL (ref 8–23)
CO2: 25 mmol/L (ref 22–32)
Calcium: 9.3 mg/dL (ref 8.9–10.3)
Chloride: 103 mmol/L (ref 98–111)
Creatinine, Ser: 0.85 mg/dL (ref 0.61–1.24)
GFR, Estimated: >60 mL/min (ref >60)
Glucose, Bld: 99 mg/dL (ref 70–99)
Potassium: 4.2 mmol/L (ref 3.5–5.1)
Sodium: 136 mmol/L (ref 135–145)

## 2020-08-24 LAB — CBC
HCT: 44.6 % (ref 39.0–52.0)
Hemoglobin: 14.7 g/dL (ref 13.0–17.0)
MCH: 32.5 pg (ref 26.0–34.0)
MCHC: 33 g/dL (ref 30.0–36.0)
MCV: 98.5 fL (ref 80.0–100.0)
Platelets: 202 10*3/uL (ref 150–400)
RBC: 4.53 MIL/uL (ref 4.22–5.81)
RDW: 13.1 % (ref 11.5–15.5)
WBC: 5.3 10*3/uL (ref 4.0–10.5)
nRBC: 0 % (ref 0.0–0.2)

## 2020-08-24 MED ORDER — AZITHROMYCIN 250 MG PO TABS: ORAL_TABLET | ORAL | 0 refills | Status: AC

## 2020-08-24 MED ORDER — HYDROCOD POLST-CPM POLST ER 10-8 MG/5ML PO SUER
5.0000 mL | Freq: Two times a day (BID) | ORAL | Status: AC | PRN
Start: 2020-08-24 — End: ?

## 2020-08-24 NOTE — ED Notes (Signed)
SDU Breakfast Ordered 

## 2020-08-24 NOTE — Progress Notes (Addendum)
  Subjective: Feels better CXR last pm shows improvement L spontaneous pneumothorax Cough improved Will repeat PCXR this am and if no change let patient go home for office followup with CXR tomorrow Objective: Vital signs in last 24 hours: Temp:  [97.7 F (36.5 C)-98.2 F (36.8 C)] 98.1 F (36.7 C) (10/30 2327) Pulse Rate:  [53-86] 67 (10/31 0800) Resp:  [10-29] 22 (10/31 0800) BP: (113-162)/(65-117) 158/96 (10/31 0800) SpO2:  [90 %-97 %] 96 % (10/31 0800) Weight:  [83 kg] 83 kg (10/30 1037)  Hemodynamic parameters for last 24 hours:  stable off O2  Intake/Output from previous day: No intake/output data recorded. Intake/Output this shift: No intake/output data recorded.  Breath sounds equal Scattered rhonchi  Lab Results: Recent Labs    08/23/20 1239 08/24/20 0522  WBC 7.1 5.3  HGB 15.1 14.7  HCT 44.0 44.6  PLT 252 202   BMET:  Recent Labs    08/23/20 1239 08/24/20 0522  NA 136 136  K 4.1 4.2  CL 101 103  CO2 24 25  GLUCOSE 92 99  BUN 15 11  CREATININE 0.87 0.85  CALCIUM 9.1 9.3    PT/INR: No results for input(s): LABPROT, INR in the last 72 hours. ABG No results found for: PHART, HCO3, TCO2, ACIDBASEDEF, O2SAT CBG (last 3)  No results for input(s): GLUCAP in the last 72 hours.  Assessment/Plan: S/P small spontaneous L pntx assoc with coughing, underlying COPD CXR remains stable Patient ambulated in hall on room air- O2 sat remained at 96%  Plan: DC patient home after CXR this am if no change Cont, finish course of Zpack, cont tussionex Office appt with CXR tomorrow at 3 pm DC instructions reviewed with patient  LOS: 1 day    Tharon Aquas Trigt III 08/24/2020

## 2020-08-25 ENCOUNTER — Other Ambulatory Visit: Payer: Self-pay | Admitting: Cardiothoracic Surgery

## 2020-08-25 ENCOUNTER — Ambulatory Visit: Payer: PPO | Admitting: Physician Assistant

## 2020-08-25 ENCOUNTER — Other Ambulatory Visit: Payer: Self-pay

## 2020-08-25 ENCOUNTER — Ambulatory Visit: Payer: PPO | Admitting: Cardiothoracic Surgery

## 2020-08-25 ENCOUNTER — Ambulatory Visit
Admission: RE | Admit: 2020-08-25 | Discharge: 2020-08-25 | Disposition: A | Discharge status: Home / Self Care | Payer: PPO | Source: Ambulatory Visit | Attending: Cardiothoracic Surgery | Admitting: Cardiothoracic Surgery

## 2020-08-25 VITALS — BP 170/98 | HR 80 | Temp 97.7°F | Resp 20 | Ht 72.0 in | Wt 183.0 lb

## 2020-08-25 DIAGNOSIS — J939 Pneumothorax, unspecified: Secondary | ICD-10-CM

## 2020-08-25 DIAGNOSIS — J984 Other disorders of lung: Secondary | ICD-10-CM | POA: Diagnosis not present

## 2020-08-25 DIAGNOSIS — J929 Pleural plaque without asbestos: Secondary | ICD-10-CM | POA: Diagnosis not present

## 2020-08-25 NOTE — Patient Instructions (Signed)
Pneumothorax A pneumothorax is commonly called a collapsed lung. It is a condition in which air leaks from a lung and builds up between the thin layer of tissue that covers the lungs (visceral pleura) and the interior wall of the chest cavity (parietal pleura). The air gets trapped outside the lung, between the lung and the chest wall (pleural space). The air takes up space and prevents the lung from fully expanding. This condition sometimes occurs suddenly with no apparent cause. The buildup of air may be small or large. A small pneumothorax may go away on its own. A large pneumothorax will require treatment and hospitalization. What are the causes? This condition may be caused by:  Trauma and injury to the chest wall.  Surgery and other medical procedures.  A complication of an underlying lung problem, especially chronic obstructive pulmonary disease (COPD) or emphysema. Sometimes the cause of this condition is not known. What increases the risk? You are more likely to develop this condition if:  You have an underlying lung problem.  You smoke.  You are 20-40 years old, male, tall, and underweight.  You have a personal or family history of pneumothorax.  You have an eating disorder (anorexia nervosa). This condition can also happen quickly, even in people with no history of lung problems. What are the signs or symptoms? Sometimes a pneumothorax will have no symptoms. When symptoms are present, they can include:  Chest pain.  Shortness of breath.  Increased rate of breathing.  Bluish color to your lips or skin (cyanosis). How is this diagnosed? This condition may be diagnosed by:  A medical history and physical exam.  A chest X-ray, chest CT scan, or ultrasound. How is this treated? Treatment depends on how severe your condition is. The goal of treatment is to remove the extra air and allow your lung to expand back to its normal size.  For a small pneumothorax: ? No  treatment may be needed. ? Extra oxygen is sometimes used to make it go away more quickly.  For a large pneumothorax or a pneumothorax that is causing symptoms, a procedure is done to drain the air from your lungs. To do this, a health care provider may use: ? A needle with a syringe. This is used to suck air from a pleural space where no additional leakage is taking place. ? A chest tube. This is used to suck air where there is ongoing leakage into the pleural space. The chest tube may need to remain in place for several days until the air leak has healed.  In more severe cases, surgery may be needed to repair the damage that is causing the leak.  If you have multiple pneumothorax episodes or have an air leak that will not heal, a procedure called a pleurodesis may be done. A medicine is placed in the pleural space to irritate the tissues around the lung so that the lung will stick to the chest wall, seal any leaks, and stop any buildup of air in that space. If you have an underlying lung problem, severe symptoms, or a large pneumothorax you will usually need to stay in the hospital. Follow these instructions at home: Lifestyle  Do not use any products that contain nicotine or tobacco, such as cigarettes and e-cigarettes. These are major risk factors in pneumothorax. If you need help quitting, ask your health care provider.  Do not lift anything that is heavier than 10 lb (4.5 kg), or the limit that your health care   provider tells you, until he or she says that it is safe.  Avoid activities that take a lot of effort (strenuous) for as long as told by your health care provider.  Return to your normal activities as told by your health care provider. Ask your health care provider what activities are safe for you.  Do not fly in an airplane or scuba dive until your health care provider says it is okay. General instructions  Take over-the-counter and prescription medicines only as told by your  health care provider.  If a cough or pain makes it difficult for you to sleep at night, try sleeping in a semi-upright position in a recliner or by using 2 or 3 pillows.  If you had a chest tube and it was removed, ask your health care provider when you can remove the bandage (dressing). While the dressing is in place, do not allow it to get wet.  Keep all follow-up visits as told by your health care provider. This is important. Contact a health care provider if:  You cough up thick mucus (sputum) that is yellow or green in color.  You were treated with a chest tube, and you have redness, increasing pain, or discharge at the site where it was placed. Get help right away if:  You have increasing chest pain or shortness of breath.  You have a cough that will not go away.  You begin coughing up blood.  You have pain that is getting worse or is not controlled with medicines.  The site where your chest tube was located opens up.  You feel air coming out of the site where the chest tube was placed.  You have a fever or persistent symptoms for more than 2-3 days.  You have a fever and your symptoms suddenly get worse. These symptoms may represent a serious problem that is an emergency. Do not wait to see if the symptoms will go away. Get medical help right away. Call your local emergency services (911 in the [place] Do not drive yourself to the hospital. Summary  A pneumothorax, commonly called a collapsed lung, is a condition in which air leaks from a lung and gets trapped between the lung and the chest wall (pleural space).  The buildup of air may be small or large. A small pneumothorax may go away on its own. A large pneumothorax will require treatment and hospitalization.  Treatment for this condition depends on how severe the pneumothorax is. The goal of treatment is to remove the extra air and allow the lung to expand back to its normal size. This information is not intended to  replace advice given to you by your health care provider. Make sure you discuss any questions you have with your health care provider. Document Revised: 09/23/2017 Document Reviewed: 09/19/2017 Elsevier Patient Education  2020 [employer]  

## 2020-08-25 NOTE — Progress Notes (Signed)
HPI:  Patient returns follow up CXR for pneumothorax.  The patient presented to [facility] over the weekend with complaints of cough.  Workup showed a left sided pneumothorax.  He was subsequently referred to [facility] for possible chest tube placement.  He was evaluated by Dr. Prescott Gum who felt chest tube was not indicated due to pneumothorax being small.  However he recommended overnight observation and treatment with bronchodilators. Repeat CXR obtained yesterday, showed stable appearance of pneumothorax.  He was discharged home with instruction to report to our office for repeat CXR today.  Overall the patient states he is doing okay.  He states that he has been able to clear mucous more effectively after starting Mucinex.  He states he is a little stressed with house rennovations going on.  He also states his routine is a little out of sorts since being in the ED.  He did not take his blood pressure medications this morning.  Current Outpatient Medications  Medication Sig Dispense Refill  . acetaminophen (TYLENOL) 500 MG tablet Take 1,000 mg by mouth every 6 (six) hours as needed for mild pain.    . Ascorbic Acid (VITAMIN C) 1000 MG tablet Take 500 mg by mouth daily.    Marland Kitchen atorvastatin (LIPITOR) 20 MG tablet Take 10 mg by mouth daily.     Marland Kitchen azithromycin (ZITHROMAX Z-PAK) 250 MG tablet Take 2 tablets (500 mg) on  Day 1,  followed by 1 tablet (250 mg) once daily on Days 2 through 5. 6 each 0  . clopidogrel (PLAVIX) 75 MG tablet Take 75 mg by mouth daily.     . metoprolol succinate (TOPROL-XL) 25 MG 24 hr tablet Take 25 mg by mouth daily.      Current Facility-Administered Medications  Medication Dose Route Frequency Provider Last Rate Last Admin  . chlorpheniramine-HYDROcodone (TUSSIONEX) 10-8 MG/5ML suspension 5 mL  5 mL Oral Q12H PRN John Giovanni, PA-C        Physical Exam   Gen: no apparent distress Heart: RRR Lungs: CTA bilaterally  Diagnostic Tests:  CXR: stable to slightly  improved appearance of pneumothorax   A/P:  1. Left Spontaneous Pneumothorax- appears stable to slightly improved 2. COPD 3. HTN- elevated in office today, however patient didn't take antihypertensives this morning, states he will take them when he gets home 4. CAD S/P PCI 5. Dispo- patient stable with stable to slightly improved pneumothorax, continue to observe and manage conservatively.  Will have patient RTC in 1 week with repeat CXR  Ellwood Handler, PA-C [facility] [phone]

## 2020-08-28 LAB — CULTURE, BLOOD (ROUTINE X 2)
Culture: NO GROWTH
Culture: NO GROWTH
Special Requests: ADEQUATE
Special Requests: ADEQUATE

## 2020-09-02 ENCOUNTER — Other Ambulatory Visit: Payer: Self-pay | Admitting: Cardiothoracic Surgery

## 2020-09-02 DIAGNOSIS — I70213 Atherosclerosis of native arteries of extremities with intermittent claudication, bilateral legs: Secondary | ICD-10-CM | POA: Diagnosis not present

## 2020-09-02 DIAGNOSIS — I2510 Atherosclerotic heart disease of native coronary artery without angina pectoris: Secondary | ICD-10-CM | POA: Diagnosis not present

## 2020-09-02 DIAGNOSIS — I10 Essential (primary) hypertension: Secondary | ICD-10-CM | POA: Diagnosis not present

## 2020-09-02 DIAGNOSIS — J939 Pneumothorax, unspecified: Secondary | ICD-10-CM

## 2020-09-02 DIAGNOSIS — E7800 Pure hypercholesterolemia, unspecified: Secondary | ICD-10-CM | POA: Diagnosis not present

## 2020-09-03 ENCOUNTER — Encounter: Payer: Self-pay | Admitting: Cardiothoracic Surgery

## 2020-09-03 ENCOUNTER — Ambulatory Visit: Payer: PPO | Admitting: Cardiothoracic Surgery

## 2020-09-03 ENCOUNTER — Ambulatory Visit
Admission: RE | Admit: 2020-09-03 | Discharge: 2020-09-03 | Disposition: A | Discharge status: Home / Self Care | Payer: PPO | Source: Ambulatory Visit | Attending: Cardiothoracic Surgery | Admitting: Cardiothoracic Surgery

## 2020-09-03 ENCOUNTER — Other Ambulatory Visit: Payer: Self-pay

## 2020-09-03 VITALS — BP 150/93 | HR 69 | Resp 18 | Ht 72.0 in | Wt 183.0 lb

## 2020-09-03 DIAGNOSIS — J449 Chronic obstructive pulmonary disease, unspecified: Secondary | ICD-10-CM | POA: Diagnosis not present

## 2020-09-03 DIAGNOSIS — J9383 Other pneumothorax: Secondary | ICD-10-CM | POA: Diagnosis not present

## 2020-09-03 DIAGNOSIS — J939 Pneumothorax, unspecified: Secondary | ICD-10-CM

## 2020-09-03 NOTE — Progress Notes (Signed)
PCP is Rudene Anda, MD Referring Provider is Pattricia Boss, MD  Chief Complaint  Patient presents with  . Spontaneous Pneumothorax    1 week f/u with CXR     HPI: Patient presents for final office visit with chest x-ray.  He presented to the ED about 2 weeks ago with a small left-sided spontaneous pneumothorax and COPD.  This was associated with coughing spell from sinusitis and drainage.  This was treated nonoperatively.  Last week a chest x-ray was taken which showed improvement but not resolution of the pneumothorax.  Today he feels much better without any shortness of breath and the chest x-ray which I reviewed personally today shows almost complete resolution of the left pneumothorax, now less than 5%.  There is no significant pleural effusion or other new findings.  The patient's been taking Mucinex to help with his cough and I have also recommended adding Allegra for the sinus allergy symptoms.   Past Medical History:  Diagnosis Date  . Coronary artery disease   . Hypertension     Past Surgical History:  Procedure Laterality Date  . CORONARY STENT PLACEMENT      History reviewed. No pertinent family history.  Social History Social History   Tobacco Use  . Smoking status: Never Smoker  . Smokeless tobacco: Never Used  Vaping Use  . Vaping Use: Never used  Substance Use Topics  . Alcohol use: Yes  . Drug use: Never    Current Outpatient Medications  Medication Sig Dispense Refill  . acetaminophen (TYLENOL) 500 MG tablet Take 1,000 mg by mouth every 6 (six) hours as needed for mild pain.    . Ascorbic Acid (VITAMIN C) 1000 MG tablet Take 500 mg by mouth daily.    Marland Kitchen atorvastatin (LIPITOR) 20 MG tablet Take 10 mg by mouth daily.     . clopidogrel (PLAVIX) 75 MG tablet Take 75 mg by mouth daily.     . metoprolol succinate (TOPROL-XL) 25 MG 24 hr tablet Take 25 mg by mouth daily.      Current Facility-Administered Medications  Medication Dose Route Frequency Provider  Last Rate Last Admin  . chlorpheniramine-HYDROcodone (TUSSIONEX) 10-8 MG/5ML suspension 5 mL  5 mL Oral Q12H PRN Gold, Wayne E, PA-C        No Known Allergies  Review of Systems  Was seen by his cardiologist yesterday for his history of stent. No cardiac symptoms Patient has been reducing his activity load because of the pneumothorax  BP (!) 150/93 (BP Location: Left Arm, Patient Position: Sitting)   Pulse 69   Resp 18   Ht 6' (1.829 m)   Wt 183 lb (83 kg)   SpO2 96% Comment: RA with mask on  BMI 24.82 kg/m  Physical Exam      Exam    General- alert and comfortable    Neck- no JVD, no cervical adenopathy palpable, no carotid bruit   Lungs- clear without rales, wheezes   Cor- regular rate and rhythm, no murmur , gallop   Abdomen- soft, non-tender   Extremities - warm, non-tender, minimal edema   Neuro- oriented, appropriate, no focal weakness   Diagnostic Tests:  Chest x-ray performed today personally reviewed with significant improvement in the spontaneous left pneumothorax.  Now less than 5% Impression:   Plan: Patient can resume normal activities but avoid heavy strenuous activities.  No further chest x-rays are needed.  He is advised to get a chest x-ray if he has recurrent symptoms of his  shortness of breath that led to his presentation to the ED about 2 weeks ago.   Len Childs, MD [facility] [phone]

## 2020-10-07 DIAGNOSIS — B009 Herpesviral infection, unspecified: Secondary | ICD-10-CM | POA: Diagnosis not present

## 2020-10-07 DIAGNOSIS — L700 Acne vulgaris: Secondary | ICD-10-CM | POA: Diagnosis not present

## 2020-10-07 DIAGNOSIS — Z85828 Personal history of other malignant neoplasm of skin: Secondary | ICD-10-CM | POA: Diagnosis not present

## 2020-10-07 DIAGNOSIS — L821 Other seborrheic keratosis: Secondary | ICD-10-CM | POA: Diagnosis not present

## 2020-10-07 DIAGNOSIS — L814 Other melanin hyperpigmentation: Secondary | ICD-10-CM | POA: Diagnosis not present

## 2020-10-07 DIAGNOSIS — X32XXXS Exposure to sunlight, sequela: Secondary | ICD-10-CM | POA: Diagnosis not present

## 2020-10-07 DIAGNOSIS — D1801 Hemangioma of skin and subcutaneous tissue: Secondary | ICD-10-CM | POA: Diagnosis not present

## 2020-10-07 DIAGNOSIS — D239 Other benign neoplasm of skin, unspecified: Secondary | ICD-10-CM | POA: Diagnosis not present

## 2020-10-07 DIAGNOSIS — L570 Actinic keratosis: Secondary | ICD-10-CM | POA: Diagnosis not present

## 2021-01-14 DIAGNOSIS — R1033 Periumbilical pain: Secondary | ICD-10-CM | POA: Diagnosis not present

## 2021-01-14 DIAGNOSIS — R1310 Dysphagia, unspecified: Secondary | ICD-10-CM | POA: Diagnosis not present

## 2021-01-14 DIAGNOSIS — Z1211 Encounter for screening for malignant neoplasm of colon: Secondary | ICD-10-CM | POA: Diagnosis not present

## 2021-04-03 DIAGNOSIS — Z91018 Allergy to other foods: Secondary | ICD-10-CM | POA: Diagnosis not present

## 2021-04-03 DIAGNOSIS — I10 Essential (primary) hypertension: Secondary | ICD-10-CM | POA: Diagnosis not present

## 2021-04-03 DIAGNOSIS — F418 Other specified anxiety disorders: Secondary | ICD-10-CM | POA: Diagnosis not present

## 2021-04-03 DIAGNOSIS — Z0000 Encounter for general adult medical examination without abnormal findings: Secondary | ICD-10-CM | POA: Diagnosis not present

## 2021-04-03 DIAGNOSIS — E7849 Other hyperlipidemia: Secondary | ICD-10-CM | POA: Diagnosis not present

## 2021-04-03 DIAGNOSIS — J3489 Other specified disorders of nose and nasal sinuses: Secondary | ICD-10-CM | POA: Diagnosis not present

## 2021-04-03 DIAGNOSIS — I2510 Atherosclerotic heart disease of native coronary artery without angina pectoris: Secondary | ICD-10-CM | POA: Diagnosis not present

## 2021-04-03 DIAGNOSIS — R059 Cough, unspecified: Secondary | ICD-10-CM | POA: Diagnosis not present

## 2021-04-03 DIAGNOSIS — F17210 Nicotine dependence, cigarettes, uncomplicated: Secondary | ICD-10-CM | POA: Diagnosis not present

## 2021-04-06 DIAGNOSIS — I2510 Atherosclerotic heart disease of native coronary artery without angina pectoris: Secondary | ICD-10-CM | POA: Diagnosis not present

## 2021-04-06 DIAGNOSIS — Z0001 Encounter for general adult medical examination with abnormal findings: Secondary | ICD-10-CM | POA: Diagnosis not present

## 2021-04-06 DIAGNOSIS — R059 Cough, unspecified: Secondary | ICD-10-CM | POA: Diagnosis not present

## 2021-04-06 DIAGNOSIS — Z91018 Allergy to other foods: Secondary | ICD-10-CM | POA: Diagnosis not present

## 2021-04-06 DIAGNOSIS — Z79899 Other long term (current) drug therapy: Secondary | ICD-10-CM | POA: Diagnosis not present

## 2021-04-06 DIAGNOSIS — I10 Essential (primary) hypertension: Secondary | ICD-10-CM | POA: Diagnosis not present

## 2021-04-06 DIAGNOSIS — Z125 Encounter for screening for malignant neoplasm of prostate: Secondary | ICD-10-CM | POA: Diagnosis not present

## 2021-04-06 DIAGNOSIS — F17200 Nicotine dependence, unspecified, uncomplicated: Secondary | ICD-10-CM | POA: Diagnosis not present

## 2021-04-06 DIAGNOSIS — E7849 Other hyperlipidemia: Secondary | ICD-10-CM | POA: Diagnosis not present

## 2021-04-06 DIAGNOSIS — F418 Other specified anxiety disorders: Secondary | ICD-10-CM | POA: Diagnosis not present

## 2021-04-06 DIAGNOSIS — J3489 Other specified disorders of nose and nasal sinuses: Secondary | ICD-10-CM | POA: Diagnosis not present

## 2021-04-20 DIAGNOSIS — L570 Actinic keratosis: Secondary | ICD-10-CM | POA: Diagnosis not present

## 2021-04-20 DIAGNOSIS — D239 Other benign neoplasm of skin, unspecified: Secondary | ICD-10-CM | POA: Diagnosis not present

## 2021-04-20 DIAGNOSIS — L578 Other skin changes due to chronic exposure to nonionizing radiation: Secondary | ICD-10-CM | POA: Diagnosis not present

## 2021-04-20 DIAGNOSIS — L821 Other seborrheic keratosis: Secondary | ICD-10-CM | POA: Diagnosis not present

## 2021-04-20 DIAGNOSIS — D692 Other nonthrombocytopenic purpura: Secondary | ICD-10-CM | POA: Diagnosis not present

## 2021-04-20 DIAGNOSIS — Z85828 Personal history of other malignant neoplasm of skin: Secondary | ICD-10-CM | POA: Diagnosis not present

## 2021-04-20 DIAGNOSIS — W908XXS Exposure to other nonionizing radiation, sequela: Secondary | ICD-10-CM | POA: Diagnosis not present

## 2021-04-20 DIAGNOSIS — X32XXXS Exposure to sunlight, sequela: Secondary | ICD-10-CM | POA: Diagnosis not present

## 2021-04-20 DIAGNOSIS — L814 Other melanin hyperpigmentation: Secondary | ICD-10-CM | POA: Diagnosis not present

## 2021-04-22 DIAGNOSIS — F17210 Nicotine dependence, cigarettes, uncomplicated: Secondary | ICD-10-CM | POA: Diagnosis not present

## 2021-05-14 DIAGNOSIS — I739 Peripheral vascular disease, unspecified: Secondary | ICD-10-CM | POA: Diagnosis not present

## 2021-05-14 DIAGNOSIS — I252 Old myocardial infarction: Secondary | ICD-10-CM | POA: Diagnosis not present

## 2021-05-14 DIAGNOSIS — I2510 Atherosclerotic heart disease of native coronary artery without angina pectoris: Secondary | ICD-10-CM | POA: Diagnosis not present

## 2021-05-14 DIAGNOSIS — I10 Essential (primary) hypertension: Secondary | ICD-10-CM | POA: Diagnosis not present

## 2021-07-14 DIAGNOSIS — R059 Cough, unspecified: Secondary | ICD-10-CM | POA: Diagnosis not present

## 2021-07-14 DIAGNOSIS — R0982 Postnasal drip: Secondary | ICD-10-CM | POA: Diagnosis not present

## 2021-07-14 DIAGNOSIS — J3489 Other specified disorders of nose and nasal sinuses: Secondary | ICD-10-CM | POA: Diagnosis not present

## 2021-08-12 DIAGNOSIS — Z13820 Encounter for screening for osteoporosis: Secondary | ICD-10-CM | POA: Diagnosis not present

## 2021-08-12 DIAGNOSIS — M85852 Other specified disorders of bone density and structure, left thigh: Secondary | ICD-10-CM | POA: Diagnosis not present

## 2021-08-12 DIAGNOSIS — M8588 Other specified disorders of bone density and structure, other site: Secondary | ICD-10-CM | POA: Diagnosis not present

## 2021-08-12 DIAGNOSIS — M47816 Spondylosis without myelopathy or radiculopathy, lumbar region: Secondary | ICD-10-CM | POA: Diagnosis not present

## 2021-08-14 DIAGNOSIS — D124 Benign neoplasm of descending colon: Secondary | ICD-10-CM | POA: Diagnosis not present

## 2021-08-14 DIAGNOSIS — D123 Benign neoplasm of transverse colon: Secondary | ICD-10-CM | POA: Diagnosis not present

## 2021-08-14 DIAGNOSIS — K208 Other esophagitis without bleeding: Secondary | ICD-10-CM | POA: Diagnosis not present

## 2021-08-14 DIAGNOSIS — K635 Polyp of colon: Secondary | ICD-10-CM | POA: Diagnosis not present

## 2021-08-14 DIAGNOSIS — K2980 Duodenitis without bleeding: Secondary | ICD-10-CM | POA: Diagnosis not present

## 2021-08-14 DIAGNOSIS — K449 Diaphragmatic hernia without obstruction or gangrene: Secondary | ICD-10-CM | POA: Diagnosis not present

## 2021-08-14 DIAGNOSIS — Z1211 Encounter for screening for malignant neoplasm of colon: Secondary | ICD-10-CM | POA: Diagnosis not present

## 2021-08-14 DIAGNOSIS — K5730 Diverticulosis of large intestine without perforation or abscess without bleeding: Secondary | ICD-10-CM | POA: Diagnosis not present

## 2021-08-14 DIAGNOSIS — K2950 Unspecified chronic gastritis without bleeding: Secondary | ICD-10-CM | POA: Diagnosis not present

## 2021-08-14 DIAGNOSIS — Z86010 Personal history of colonic polyps: Secondary | ICD-10-CM | POA: Diagnosis not present

## 2021-08-14 DIAGNOSIS — K640 First degree hemorrhoids: Secondary | ICD-10-CM | POA: Diagnosis not present

## 2021-08-14 DIAGNOSIS — K222 Esophageal obstruction: Secondary | ICD-10-CM | POA: Diagnosis not present

## 2021-08-14 DIAGNOSIS — K209 Esophagitis, unspecified without bleeding: Secondary | ICD-10-CM | POA: Diagnosis not present

## 2021-09-08 DIAGNOSIS — I10 Essential (primary) hypertension: Secondary | ICD-10-CM | POA: Diagnosis not present

## 2021-09-08 DIAGNOSIS — E7800 Pure hypercholesterolemia, unspecified: Secondary | ICD-10-CM | POA: Diagnosis not present

## 2021-09-08 DIAGNOSIS — I2510 Atherosclerotic heart disease of native coronary artery without angina pectoris: Secondary | ICD-10-CM | POA: Diagnosis not present

## 2022-10-19 ENCOUNTER — Encounter (HOSPITAL_BASED_OUTPATIENT_CLINIC_OR_DEPARTMENT_OTHER): Payer: Self-pay | Admitting: Emergency Medicine

## 2022-10-19 ENCOUNTER — Emergency Department (HOSPITAL_BASED_OUTPATIENT_CLINIC_OR_DEPARTMENT_OTHER)
Admission: EM | Admit: 2022-10-19 | Discharge: 2022-10-19 | Disposition: A | Discharge status: Home / Self Care | Payer: PPO | Attending: Emergency Medicine | Admitting: Emergency Medicine

## 2022-10-19 ENCOUNTER — Other Ambulatory Visit: Payer: Self-pay

## 2022-10-19 DIAGNOSIS — I10 Essential (primary) hypertension: Secondary | ICD-10-CM | POA: Insufficient documentation

## 2022-10-19 DIAGNOSIS — I2510 Atherosclerotic heart disease of native coronary artery without angina pectoris: Secondary | ICD-10-CM | POA: Insufficient documentation

## 2022-10-19 DIAGNOSIS — J101 Influenza due to other identified influenza virus with other respiratory manifestations: Secondary | ICD-10-CM | POA: Insufficient documentation

## 2022-10-19 DIAGNOSIS — Z7902 Long term (current) use of antithrombotics/antiplatelets: Secondary | ICD-10-CM | POA: Insufficient documentation

## 2022-10-19 DIAGNOSIS — R059 Cough, unspecified: Secondary | ICD-10-CM | POA: Diagnosis present

## 2022-10-19 DIAGNOSIS — Z1152 Encounter for screening for COVID-19: Secondary | ICD-10-CM | POA: Diagnosis not present

## 2022-10-19 LAB — RESP PANEL BY RT-PCR (RSV, FLU A&B, COVID)  RVPGX2
Influenza A by PCR: POSITIVE — AB
Influenza B by PCR: NEGATIVE
Resp Syncytial Virus by PCR: NEGATIVE
SARS Coronavirus 2 by RT PCR: NEGATIVE

## 2022-10-19 MED ORDER — OSELTAMIVIR PHOSPHATE 75 MG PO CAPS
75.0000 mg | ORAL_CAPSULE | ORAL | Status: AC
  Administered 2022-10-19: 75 mg via ORAL
  Filled 2022-10-19: qty 1

## 2022-10-19 MED ORDER — OSELTAMIVIR PHOSPHATE 75 MG PO CAPS: 75.0000 mg | ORAL_CAPSULE | Freq: Two times a day (BID) | ORAL | 0 refills | Status: AC

## 2022-10-19 NOTE — Discharge Instructions (Addendum)
You were seen for your influenza in the emergency department.   At home, please take Tylenol as needed for any fevers or muscle aches and the Tamiflu we have prescribed you.    Follow-up with your primary doctor in 2-3 days regarding your visit.    Return immediately to the emergency department if you experience any of the following: Difficulty breathing, chest pain, or any other concerning symptoms.    Thank you for visiting our Emergency Department. It was a pleasure taking care of you today.

## 2022-10-19 NOTE — ED Triage Notes (Signed)
Runny nose and cough since yesterday.  No known fever.

## 2022-10-19 NOTE — ED Provider Notes (Signed)
  [facility] EMERGENCY DEPARTMENT Provider Note   CSN: [id number] Arrival date & time: 10/19/22  0556     History  Chief Complaint  Patient presents with   Cough    Calvin Moses is a 71 y.o. male.  The history is provided by the patient.  Cough He has history of hypertension, coronary artery disease and comes in with postnasal drainage and cough which started yesterday.  He has a headache but denies any arthralgias or myalgias.  He denies fever, chills, sweats.  He denies any sick contacts.   Home Medications Prior to Admission medications   Medication Sig Start Date End Date Taking? Authorizing Provider  acetaminophen (TYLENOL) 500 MG tablet Take 1,000 mg by mouth every 6 (six) hours as needed for mild pain.    [provider]  Ascorbic Acid (VITAMIN C) 1000 MG tablet Take 500 mg by mouth daily.    [provider]  atorvastatin (LIPITOR) 20 MG tablet Take 10 mg by mouth daily.  01/11/17   [provider]  clopidogrel (PLAVIX) 75 MG tablet Take 75 mg by mouth daily.  12/24/16   [provider]  metoprolol succinate (TOPROL-XL) 25 MG 24 hr tablet Take 25 mg by mouth daily.  01/21/20   [provider]      Allergies    Patient has no known allergies.    Review of Systems   Review of Systems  Respiratory:  Positive for cough.   All other systems reviewed and are negative.   Physical Exam Updated Vital Signs BP 137/77 (BP Location: Right Arm)   Pulse 76   Temp 99.7 F (37.6 C) (Oral)   Resp 20   Ht 6' (1.829 m)   Wt 82.6 kg   SpO2 96%   BMI 24.68 kg/m  Physical Exam Vitals and nursing note reviewed.   71 year old male, resting comfortably and in no acute distress. Vital signs are normal. Oxygen saturation is 96%, which is normal. Head is normocephalic and atraumatic. PERRLA, EOMI. Oropharynx is clear.  There is no sinus tenderness. Neck is nontender and supple without adenopathy or JVD. Back is nontender and  there is no CVA tenderness. Lungs are clear without rales, wheezes, or rhonchi. Chest is nontender. Heart has regular rate and rhythm without murmur. Abdomen is soft, flat, nontender. Extremities have no cyanosis or edema, full range of motion is present. Skin is warm and dry without rash. Neurologic: Mental status is normal, cranial nerves are intact, moves all extremities equally.  ED Results / Procedures / Treatments   Labs (all labs ordered are listed, but only abnormal results are displayed) Labs Reviewed  RESP PANEL BY RT-PCR (RSV, FLU A&B, COVID)  [id number]   Procedures Procedures    Medications Ordered in ED Medications - No data to display  ED Course/ Medical Decision Making/ A&P                           Medical Decision Making  Influenza-like illness.  Consider influenza, COVID-19, RSV, other viral pathogens.  Doubt bacterial pneumonia.  I have ordered respiratory pathogen panel.  Case is signed out to Dr. Philip Aspen.  Final Clinical Impression(s) / ED Diagnoses Final diagnoses:  Influenza-like illness    Rx / DC Orders ED Discharge Orders     None         Delora Fuel, MD 78/29/56 [phone]

## 2022-10-19 NOTE — ED Provider Notes (Signed)
  Physical Exam  BP 137/77 (BP Location: Right Arm)   Pulse 76   Temp 99.7 F (37.6 C) (Oral)   Resp 20   Ht 6' (1.829 m)   Wt 82.6 kg   SpO2 96%   BMI 24.68 kg/m   Physical Exam  Procedures  Procedures  ED Course / MDM   Clinical Course as of 10/20/22 2014  Tue Oct 19, 2022  0703 Assumed care from Dr. Roxanne Mins.  71 year old male with history of CAD and hypertension who presented with URI symptoms that started yesterday.  Has been found to be influenza positive.  Will give Tamiflu at this time with his symptom onset being within the past 48 hours.  On repeat evaluation this very well-appearing.  Satting well on room air.  Will have him follow-up with his primary doctor in 2 to 3 days.  Given a written prescription of Tamiflu to go home with. [RP]    Clinical Course User Index [RP] Fransico Meadow, MD   Medical Decision Making Risk Prescription drug management.     Fransico Meadow, MD 10/20/22 2014
